# Patient Record
Sex: Male | Born: 1935 | Race: White | Hispanic: No | Marital: Married | Smoking: Current some day smoker
Health system: Southern US, Community
[De-identification: ages and names within clinical notes are randomized; demographics above are authoritative.]

## PROBLEM LIST (undated history)

## (undated) DIAGNOSIS — M109 Gout, unspecified: Secondary | ICD-10-CM

## (undated) DIAGNOSIS — I10 Essential (primary) hypertension: Secondary | ICD-10-CM

## (undated) DIAGNOSIS — E538 Deficiency of other specified B group vitamins: Secondary | ICD-10-CM

## (undated) DIAGNOSIS — F0390 Unspecified dementia without behavioral disturbance: Secondary | ICD-10-CM

---

## 1998-02-11 HISTORY — PX: CHOLECYSTECTOMY: SHX55

## 2007-06-18 ENCOUNTER — Ambulatory Visit: Payer: Self-pay | Admitting: Orthopedic Surgery

## 2007-06-18 DIAGNOSIS — M23302 Other meniscus derangements, unspecified lateral meniscus, unspecified knee: Secondary | ICD-10-CM

## 2007-06-18 DIAGNOSIS — M25469 Effusion, unspecified knee: Secondary | ICD-10-CM

## 2007-06-18 DIAGNOSIS — M25569 Pain in unspecified knee: Secondary | ICD-10-CM

## 2008-05-31 ENCOUNTER — Ambulatory Visit: Payer: Self-pay | Admitting: Orthopedic Surgery

## 2008-05-31 DIAGNOSIS — M79609 Pain in unspecified limb: Secondary | ICD-10-CM

## 2008-05-31 DIAGNOSIS — M7150 Other bursitis, not elsewhere classified, unspecified site: Secondary | ICD-10-CM | POA: Insufficient documentation

## 2009-10-09 ENCOUNTER — Encounter: Admission: RE | Admit: 2009-10-09 | Discharge: 2009-10-09 | Payer: Self-pay | Admitting: Neurology

## 2011-11-12 ENCOUNTER — Encounter (HOSPITAL_COMMUNITY)
Admission: RE | Admit: 2011-11-12 | Discharge: 2011-11-12 | Disposition: A | Discharge status: Home / Self Care | Payer: Medicare Other | Source: Ambulatory Visit | Attending: Ophthalmology | Admitting: Ophthalmology

## 2011-11-12 ENCOUNTER — Encounter (HOSPITAL_COMMUNITY): Payer: Self-pay

## 2011-11-12 ENCOUNTER — Other Ambulatory Visit: Payer: Self-pay

## 2011-11-12 ENCOUNTER — Encounter (HOSPITAL_COMMUNITY): Payer: Self-pay | Admitting: Pharmacy Technician

## 2011-11-12 HISTORY — DX: Gout, unspecified: M10.9

## 2011-11-12 HISTORY — DX: Unspecified dementia, unspecified severity, without behavioral disturbance, psychotic disturbance, mood disturbance, and anxiety: F03.90

## 2011-11-12 HISTORY — DX: Essential (primary) hypertension: I10

## 2011-11-12 HISTORY — DX: Deficiency of other specified B group vitamins: E53.8

## 2011-11-12 LAB — BASIC METABOLIC PANEL
CO2: 28 mEq/L (ref 19–32)
Calcium: 10.1 mg/dL (ref 8.4–10.5)
Chloride: 100 mEq/L (ref 96–112)
GFR calc Af Amer: 46 mL/min — ABNORMAL LOW (ref >90)
Potassium: 4.9 mEq/L (ref 3.5–5.1)

## 2011-11-12 LAB — HEMOGLOBIN AND HEMATOCRIT, BLOOD: Hemoglobin: 12.7 g/dL — ABNORMAL LOW (ref 13.0–17.0)

## 2011-11-12 MED ORDER — CYCLOPENTOLATE HCL 1 % OP SOLN
OPHTHALMIC | Status: AC
  Filled 2011-11-12: qty 2

## 2011-11-12 NOTE — Patient Instructions (Addendum)
Your procedure is scheduled on: 11/18/2011  Report to [facility] at  700       AM.  Call this number if you have problems the morning of surgery: [phone]   Do not eat food or drink liquids :After Midnight.      Take these medicines the morning of surgery with A SIP OF WATER: Metoprolol,lisinopril,HCTZ   Do not wear jewelry, make-up or nail polish.  Do not wear lotions, powders, or perfumes. You may wear deodorant.  Do not shave 48 hours prior to surgery.  Do not bring valuables to the hospital.  Contacts, dentures or bridgework may not be worn into surgery.  Leave suitcase in the car. After surgery it may be brought to your room.  For patients admitted to the hospital, checkout time is 11:00 AM the day of discharge.   Patients discharged the day of surgery will not be allowed to drive home.  :     Please read over the following fact sheets that you were given: Coughing and Deep Breathing, Surgical Site Infection Prevention, Anesthesia Post-op Instructions and Care and Recovery After Surgery    Cataract A cataract is a clouding of the lens of the eye. When a lens becomes cloudy, vision is reduced based on the degree and nature of the clouding. Many cataracts reduce vision to some degree. Some cataracts make people more near-sighted as they develop. Other cataracts increase glare. Cataracts that are ignored and become worse can sometimes look white. The white color can be seen through the pupil. CAUSES   Aging. However, cataracts may occur at any age, even in newborns.   Certain drugs.   Trauma to the eye.   Certain diseases such as diabetes.   Specific eye diseases such as chronic inflammation inside the eye or a sudden attack of a rare form of glaucoma.   Inherited or acquired medical problems.  SYMPTOMS   Gradual, progressive drop in vision in the affected eye.   Severe, rapid visual loss. This most often happens when trauma is the cause.  DIAGNOSIS  To detect a  cataract, an eye doctor examines the lens. Cataracts are best diagnosed with an exam of the eyes with the pupils enlarged (dilated) by drops.  TREATMENT  For an early cataract, vision may improve by using different eyeglasses or stronger lighting. If that does not help your vision, surgery is the only effective treatment. A cataract needs to be surgically removed when vision loss interferes with your everyday activities, such as driving, reading, or watching TV. A cataract may also have to be removed if it prevents examination or treatment of another eye problem. Surgery removes the cloudy lens and usually replaces it with a substitute lens (intraocular lens, IOL).  At a time when both you and your doctor agree, the cataract will be surgically removed. If you have cataracts in both eyes, only one is usually removed at a time. This allows the operated eye to heal and be out of danger from any possible problems after surgery (such as infection or poor wound healing). In rare cases, a cataract may be doing damage to your eye. In these cases, your caregiver may advise surgical removal right away. The vast majority of people who have cataract surgery have better vision afterward. HOME CARE INSTRUCTIONS  If you are not planning surgery, you may be asked to do the following:  Use different eyeglasses.   Use stronger or brighter lighting.   Ask your eye doctor about  reducing your medicine dose or changing medicines if it is thought that a medicine caused your cataract. Changing medicines does not make the cataract go away on its own.   Become familiar with your surroundings. Poor vision can lead to injury. Avoid bumping into things on the affected side. You are at a higher risk for tripping or falling.   Exercise extreme care when driving or operating machinery.   Wear sunglasses if you are sensitive to bright light or experiencing problems with glare.  SEEK IMMEDIATE MEDICAL CARE IF:   You have a  worsening or sudden vision loss.   You notice redness, swelling, or increasing pain in the eye.   You have a fever.  Document Released: 01/28/2005 Document Revised: 01/17/2011 Document Reviewed: 09/21/2010 [facility] Patient Information 2012 [place], [place].PATIENT INSTRUCTIONS POST-ANESTHESIA  IMMEDIATELY FOLLOWING SURGERY:  Do not drive or operate machinery for the first twenty four hours after surgery.  Do not make any important decisions for twenty four hours after surgery or while taking narcotic pain medications or sedatives.  If you develop intractable nausea and vomiting or a severe headache please notify your doctor immediately.  FOLLOW-UP:  Please make an appointment with your surgeon as instructed. You do not need to follow up with anesthesia unless specifically instructed to do so.  WOUND CARE INSTRUCTIONS (if applicable):  Keep a dry clean dressing on the anesthesia/puncture wound site if there is drainage.  Once the wound has quit draining you may leave it open to air.  Generally you should leave the bandage intact for twenty four hours unless there is drainage.  If the epidural site drains for more than 36-48 hours please call the anesthesia department.  QUESTIONS?:  Please feel free to call your physician or the hospital operator if you have any questions, and they will be happy to assist you.

## 2011-11-12 NOTE — Progress Notes (Signed)
11/12/11 1316  OBSTRUCTIVE SLEEP APNEA  Have you ever been diagnosed with sleep apnea through a sleep study? No  Do you snore loudly (loud enough to be heard through closed doors)?  1  Do you often feel tired, fatigued, or sleepy during the daytime? 0  Has anyone observed you stop breathing during your sleep? 0  Do you have, or are you being treated for high blood pressure? 1  BMI more than 35 kg/m2? 0  Age over 76 years old? 1  Neck circumference greater than 40 cm/18 inches? 0  Gender: 1  Obstructive Sleep Apnea Score 4   Score 4 or greater  No PCP

## 2011-11-18 ENCOUNTER — Encounter (HOSPITAL_COMMUNITY)
Admission: RE | Disposition: A | Discharge status: Home / Self Care | Payer: Self-pay | Source: Ambulatory Visit | Attending: Ophthalmology

## 2011-11-18 ENCOUNTER — Encounter (HOSPITAL_COMMUNITY): Payer: Self-pay | Admitting: *Deleted

## 2011-11-18 ENCOUNTER — Encounter (HOSPITAL_COMMUNITY): Payer: Self-pay | Admitting: Anesthesiology

## 2011-11-18 ENCOUNTER — Ambulatory Visit (HOSPITAL_COMMUNITY)
Admission: RE | Admit: 2011-11-18 | Discharge: 2011-11-18 | Disposition: A | Discharge status: Home / Self Care | Payer: Medicare Other | Source: Ambulatory Visit | Attending: Ophthalmology | Admitting: Ophthalmology

## 2011-11-18 ENCOUNTER — Ambulatory Visit (HOSPITAL_COMMUNITY): Payer: Medicare Other | Admitting: Anesthesiology

## 2011-11-18 DIAGNOSIS — Z01810 Encounter for preprocedural cardiovascular examination: Secondary | ICD-10-CM | POA: Insufficient documentation

## 2011-11-18 DIAGNOSIS — Z01812 Encounter for preprocedural laboratory examination: Secondary | ICD-10-CM | POA: Insufficient documentation

## 2011-11-18 DIAGNOSIS — H2510 Age-related nuclear cataract, unspecified eye: Secondary | ICD-10-CM | POA: Insufficient documentation

## 2011-11-18 DIAGNOSIS — Z79899 Other long term (current) drug therapy: Secondary | ICD-10-CM | POA: Insufficient documentation

## 2011-11-18 DIAGNOSIS — E119 Type 2 diabetes mellitus without complications: Secondary | ICD-10-CM | POA: Insufficient documentation

## 2011-11-18 DIAGNOSIS — I10 Essential (primary) hypertension: Secondary | ICD-10-CM | POA: Insufficient documentation

## 2011-11-18 HISTORY — PX: CATARACT EXTRACTION W/PHACO: SHX586

## 2011-11-18 SURGERY — PHACOEMULSIFICATION, CATARACT, WITH IOL INSERTION
Anesthesia: Monitor Anesthesia Care | Site: Eye | Laterality: Left | Wound class: Clean

## 2011-11-18 MED ORDER — LIDOCAINE HCL 3.5 % OP GEL: 1.0000 "application " | Freq: Once | OPHTHALMIC | Status: DC

## 2011-11-18 MED ORDER — LIDOCAINE 3.5 % OP GEL OPTIME - NO CHARGE
OPHTHALMIC | Status: DC | PRN
  Administered 2011-11-18: 1 [drp] via OPHTHALMIC

## 2011-11-18 MED ORDER — LIDOCAINE HCL (PF) 1 % IJ SOLN
INTRAMUSCULAR | Status: DC | PRN
  Administered 2011-11-18: .5 mL

## 2011-11-18 MED ORDER — MIDAZOLAM HCL 2 MG/2ML IJ SOLN
INTRAMUSCULAR | Status: AC
  Filled 2011-11-18: qty 2

## 2011-11-18 MED ORDER — MIDAZOLAM HCL 5 MG/5ML IJ SOLN
INTRAMUSCULAR | Status: DC | PRN
  Administered 2011-11-18: 1 mg via INTRAVENOUS

## 2011-11-18 MED ORDER — MOXIFLOXACIN HCL 0.5 % OP SOLN - NO CHARGE
1.0000 [drp] | Freq: Once | OPHTHALMIC | Status: DC
  Filled 2011-11-18: qty 3

## 2011-11-18 MED ORDER — BSS IO SOLN
INTRAOCULAR | Status: DC | PRN
  Administered 2011-11-18: 15 mL via INTRAOCULAR

## 2011-11-18 MED ORDER — LACTATED RINGERS IV SOLN
INTRAVENOUS | Status: DC | PRN
  Administered 2011-11-18: 08:00:00 via INTRAVENOUS

## 2011-11-18 MED ORDER — TETRACAINE HCL 0.5 % OP SOLN
OPHTHALMIC | Status: AC
  Filled 2011-11-18: qty 2

## 2011-11-18 MED ORDER — MIDAZOLAM HCL 2 MG/2ML IJ SOLN
1.0000 mg | INTRAMUSCULAR | Status: DC | PRN
  Administered 2011-11-18: 2 mg via INTRAVENOUS

## 2011-11-18 MED ORDER — NA HYALUR & NA CHOND-NA HYALUR 0.55-0.5 ML IO KIT
PACK | INTRAOCULAR | Status: DC | PRN
  Administered 2011-11-18: 1 via OPHTHALMIC

## 2011-11-18 MED ORDER — LIDOCAINE HCL 3.5 % OP GEL
OPHTHALMIC | Status: AC
  Filled 2011-11-18: qty 5

## 2011-11-18 MED ORDER — CYCLOPENTOLATE-PHENYLEPHRINE 0.2-1 % OP SOLN
1.0000 [drp] | Freq: Once | OPHTHALMIC | Status: AC
  Administered 2011-11-18: 1 [drp] via OPHTHALMIC

## 2011-11-18 MED ORDER — KETOROLAC TROMETHAMINE 0.4 % OP SOLN - NO CHARGE
1.0000 [drp] | Freq: Once | OPHTHALMIC | Status: AC
  Administered 2011-11-18: 1 [drp] via OPHTHALMIC
  Filled 2011-11-18: qty 5

## 2011-11-18 MED ORDER — GATIFLOXACIN 0.5 % OP SOLN OPTIME - NO CHARGE
1.0000 [drp] | Freq: Once | OPHTHALMIC | Status: AC
  Administered 2011-11-18: 1 [drp] via OPHTHALMIC
  Filled 2011-11-18: qty 2.5

## 2011-11-18 MED ORDER — GATIFLOXACIN 0.5 % OP SOLN OPTIME - NO CHARGE
OPHTHALMIC | Status: DC | PRN
  Administered 2011-11-18: 1 [drp] via OPHTHALMIC

## 2011-11-18 MED ORDER — LACTATED RINGERS IV SOLN
INTRAVENOUS | Status: DC
  Administered 2011-11-18: 1000 mL via INTRAVENOUS

## 2011-11-18 MED ORDER — EPINEPHRINE HCL 1 MG/ML IJ SOLN
INTRAOCULAR | Status: DC | PRN
  Administered 2011-11-18: 09:00:00

## 2011-11-18 MED ORDER — TETRACAINE 0.5 % OP SOLN OPTIME - NO CHARGE
OPHTHALMIC | Status: DC | PRN
  Administered 2011-11-18: 2 [drp] via OPHTHALMIC

## 2011-11-18 MED ORDER — LIDOCAINE HCL (PF) 1 % IJ SOLN
INTRAMUSCULAR | Status: AC
  Filled 2011-11-18: qty 2

## 2011-11-18 SURGICAL SUPPLY — 27 items
CAPSULAR TENSION RING-AMO (OPHTHALMIC RELATED) IMPLANT
CLOTH BEACON ORANGE TIMEOUT ST (SAFETY) ×2 IMPLANT
GLOVE BIO SURGEON STRL SZ7.5 (GLOVE) IMPLANT
GLOVE BIOGEL M 6.5 STRL (GLOVE) IMPLANT
GLOVE BIOGEL PI IND STRL 6.5 (GLOVE) IMPLANT
GLOVE BIOGEL PI IND STRL 7.0 (GLOVE) ×1 IMPLANT
GLOVE BIOGEL PI INDICATOR 6.5 (GLOVE)
GLOVE BIOGEL PI INDICATOR 7.0 (GLOVE) ×1
GLOVE ECLIPSE 6.5 STRL STRAW (GLOVE) IMPLANT
GLOVE ECLIPSE 7.5 STRL STRAW (GLOVE) IMPLANT
GLOVE EXAM NITRILE LRG STRL (GLOVE) IMPLANT
GLOVE EXAM NITRILE MD LF STRL (GLOVE) ×2 IMPLANT
GLOVE SKINSENSE NS SZ6.5 (GLOVE)
GLOVE SKINSENSE NS SZ7.0 (GLOVE)
GLOVE SKINSENSE STRL SZ6.5 (GLOVE) IMPLANT
GLOVE SKINSENSE STRL SZ7.0 (GLOVE) IMPLANT
INST SET CATARACT ~~LOC~~ (KITS) ×2 IMPLANT
KIT VITRECTOMY (OPHTHALMIC RELATED) IMPLANT
PAD ARMBOARD 7.5X6 YLW CONV (MISCELLANEOUS) ×2 IMPLANT
PROC W NO LENS (INTRAOCULAR LENS)
PROC W SPEC LENS (INTRAOCULAR LENS)
PROCESS W NO LENS (INTRAOCULAR LENS) IMPLANT
PROCESS W SPEC LENS (INTRAOCULAR LENS) IMPLANT
RING MALYGIN (MISCELLANEOUS) IMPLANT
SIGHTPATH CAT PROC W REG LENS (Ophthalmic Related) ×2 IMPLANT
VISCOELASTIC ADDITIONAL (OPHTHALMIC RELATED) IMPLANT
WATER STERILE IRR 250ML POUR (IV SOLUTION) ×2 IMPLANT

## 2011-11-18 NOTE — Preoperative (Signed)
Beta Blockers   Reason not to administer Beta Blockers:Not Applicable 

## 2011-11-18 NOTE — H&P (Signed)
I have reviewed the pre printed H&P, the patient was re-examined, and I have identified no significant interval changes in the patient's medical condition.  There is no change in the plan of care since the history and physical of record. 

## 2011-11-18 NOTE — Brief Op Note (Signed)
11/18/2011  10:29 AM  PATIENT:  Charles Scott  78 y.o. male  PRE-OPERATIVE DIAGNOSIS:  nuclear cataract left eye  POST-OPERATIVE DIAGNOSIS:  nuclear cataract left eye  PROCEDURE:  Procedure(s): CATARACT EXTRACTION PHACO AND INTRAOCULAR LENS PLACEMENT (IOC)  SURGEON:  Surgeon(s): Susa Simmonds, MD  ASSISTANTS: Valda Lamb, CST   ANESTHESIA STAFF: Kristopher Key, CRNA - CRNA Laurene Footman, MD - Anesthesiologist  ANESTHESIA:   topical and MAC  REQUESTED LENS POWER: 22.5  LENS IMPLANT INFORMATION: Alcon SN60 WF  S/n [id number]  Exp 04/2015  CUMULATIVE DISSIPATED ENERGY:74.35  INDICATIONS:see H&P indications  OP FINDINGS:dense NS  COMPLICATIONS:None  DICTATION #: none  PLAN OF CARE: per office instructions  PATIENT DISPOSITION:  Short Stay

## 2011-11-18 NOTE — Op Note (Signed)
See scanned op note done today o a different system

## 2011-11-18 NOTE — Anesthesia Postprocedure Evaluation (Signed)
  Anesthesia Post-op Note  Patient: Charles Scott  Procedure(s) Performed: Procedure(s) (LRB) with comments: CATARACT EXTRACTION PHACO AND INTRAOCULAR LENS PLACEMENT (IOC) (Left) - CDE:74.35  Patient Location: Short Stay  Anesthesia Type: MAC  Level of Consciousness: awake, alert  and oriented  Airway and Oxygen Therapy: Patient Spontanous Breathing  Post-op Pain: none  Post-op Assessment: Post-op Vital signs reviewed, Patient's Cardiovascular Status Stable, Respiratory Function Stable, Patent Airway, No signs of Nausea or vomiting and Pain level controlled  Post-op Vital Signs: Reviewed and stable  Complications: No apparent anesthesia complications

## 2011-11-18 NOTE — Transfer of Care (Signed)
Immediate Anesthesia Transfer of Care Note  Patient: Charles Scott  Procedure(s) Performed: Procedure(s) (LRB) with comments: CATARACT EXTRACTION PHACO AND INTRAOCULAR LENS PLACEMENT (IOC) (Left) - CDE:74.35  Patient Location: Short Stay  Anesthesia Type: MAC  Level of Consciousness: awake, alert  and oriented  Airway & Oxygen Therapy: Patient Spontanous Breathing  Post-op Assessment: Report given to PACU RN  Post vital signs: Reviewed and stable  Complications: No apparent anesthesia complications

## 2011-11-18 NOTE — Anesthesia Preprocedure Evaluation (Addendum)
Anesthesia Evaluation  Patient identified by MRN, date of birth, ID band Patient awake    Reviewed: Allergy & Precautions, H&P , NPO status , Patient's Chart, lab work & pertinent test results  Airway Mallampati: II      Dental  (+) Teeth Intact   Pulmonary neg pulmonary ROS,    Pulmonary exam normal       Cardiovascular hypertension, Pt. on medications Rhythm:Regular     Neuro/Psych PSYCHIATRIC DISORDERS (dementia)    GI/Hepatic   Endo/Other  diabetes, Type 2, Oral Hypoglycemic Agents  Renal/GU      Musculoskeletal   Abdominal   Peds  Hematology   Anesthesia Other Findings   Reproductive/Obstetrics                           Anesthesia Physical Anesthesia Plan  ASA: III  Anesthesia Plan: MAC   Post-op Pain Management:    Induction: Intravenous  Airway Management Planned: Nasal Cannula  Additional Equipment:   Intra-op Plan:   Post-operative Plan:   Informed Consent: I have reviewed the patients History and Physical, chart, labs and discussed the procedure including the risks, benefits and alternatives for the proposed anesthesia with the patient or authorized representative who has indicated his/her understanding and acceptance.     Plan Discussed with:   Anesthesia Plan Comments:         Anesthesia Quick Evaluation  

## 2011-11-20 ENCOUNTER — Encounter (HOSPITAL_COMMUNITY): Payer: Self-pay | Admitting: Ophthalmology

## 2012-03-23 NOTE — Patient Instructions (Signed)
Charles Scott  03/23/2012   Your procedure is scheduled on:  03/30/12  Report to [facility] at 0800 AM.  Call this number if you have problems the morning of surgery: [phone]   Remember:   Do not eat food or drink liquids after midnight.   Take these medicines the morning of surgery with A SIP OF WATER: lisinopril, lopressor   Do not wear jewelry, make-up or nail polish.  Do not wear lotions, powders, or perfumes. You may wear deodorant.  Do not shave 48 hours prior to surgery. Men may shave face and neck.  Do not bring valuables to the hospital.  Contacts, dentures or bridgework may not be worn into surgery.  Leave suitcase in the car. After surgery it may be brought to your room.  For patients admitted to the hospital, checkout time is 11:00 AM the day of  discharge.   Patients discharged the day of surgery will not be allowed to drive  home.  Name and phone number of your driver: family  Special Instructions: N/A   Please read over the following fact sheets that you were given: Anesthesia Post-op Instructions and Care and Recovery After Surgery   PATIENT INSTRUCTIONS POST-ANESTHESIA  IMMEDIATELY FOLLOWING SURGERY:  Do not drive or operate machinery for the first twenty four hours after surgery.  Do not make any important decisions for twenty four hours after surgery or while taking narcotic pain medications or sedatives.  If you develop intractable nausea and vomiting or a severe headache please notify your doctor immediately.  FOLLOW-UP:  Please make an appointment with your surgeon as instructed. You do not need to follow up with anesthesia unless specifically instructed to do so.  WOUND CARE INSTRUCTIONS (if applicable):  Keep a dry clean dressing on the anesthesia/puncture wound site if there is drainage.  Once the wound has quit draining you may leave it open to air.  Generally you should leave the bandage intact for twenty four hours unless there is drainage.  If the  epidural site drains for more than 36-48 hours please call the anesthesia department.  QUESTIONS?:  Please feel free to call your physician or the hospital operator if you have any questions, and they will be happy to assist you.      Cataract Surgery  A cataract is a clouding of the lens of the eye. When a lens becomes cloudy, vision is reduced based on the degree and nature of the clouding. Surgery may be needed to improve vision. Surgery removes the cloudy lens and usually replaces it with a substitute lens (intraocular lens, IOL). LET YOUR EYE DOCTOR KNOW ABOUT:  Allergies to food or medicine.  Medicines taken including herbs, eyedrops, over-the-counter medicines, and creams.  Use of steroids (by mouth or creams).  Previous problems with anesthetics or numbing medicine.  History of bleeding problems or blood clots.  Previous surgery.  Other health problems, including diabetes and kidney problems.  Possibility of pregnancy, if this applies. RISKS AND COMPLICATIONS  Infection.  Inflammation of the eyeball (endophthalmitis) that can spread to both eyes (sympathetic ophthalmia).  Poor wound healing.  If an IOL is inserted, it can later fall out of proper position. This is very uncommon.  Clouding of the part of your eye that holds an IOL in place. This is called an "after-cataract." These are uncommon, but easily treated. BEFORE THE PROCEDURE  Do not eat or drink anything except small amounts of water for 8 to 12 before your surgery,  or as directed by your caregiver.  Unless you are told otherwise, continue any eyedrops you have been prescribed.  Talk to your primary caregiver about all other medicines that you take (both prescription and non-prescription). In some cases, you may need to stop or change medicines near the time of your surgery. This is most important if you are taking blood-thinning medicine.Do not stop medicines unless you are told to do so.  Arrange for  someone to drive you to and from the procedure.  Do not put contact lenses in either eye on the day of your surgery. PROCEDURE There is more than one method for safely removing a cataract. Your doctor can explain the differences and help determine which is best for you. Phacoemulsification surgery is the most common form of cataract surgery.  An injection is given behind the eye or eyedrops are given to make this a painless procedure.  A small cut (incision) is made on the edge of the clear, dome-shaped surface that covers the front of the eye (cornea).  A tiny probe is painlessly inserted into the eye. This device gives off ultrasound waves that soften and break up the cloudy center of the lens. This makes it easier for the cloudy lens to be removed by suction.  An IOL may be implanted.  The normal lens of the eye is covered by a clear capsule. Part of that capsule is intentionally left in the eye to support the IOL.  Your surgeon may or may not use stitches to close the incision. There are other forms of cataract surgery that require a larger incision and stiches to close the eye. This approach is taken in cases where the doctor feels that the cataract cannot be easily removed using phacoemulsification. AFTER THE PROCEDURE  When an IOL is implanted, it does not need care. It becomes a permanent part of your eye and cannot be seen or felt.  Your doctor will schedule follow-up exams to check on your progress.  Review your other medicines with your doctor to see which can be resumed after surgery.  Use eyedrops or take medicine as prescribed by your doctor. Document Released: 01/17/2011 Document Revised: 04/22/2011 Document Reviewed: 01/17/2011 [facility] Patient Information 2013 [place], [place].

## 2012-03-24 ENCOUNTER — Encounter (HOSPITAL_COMMUNITY)
Admission: RE | Admit: 2012-03-24 | Discharge: 2012-03-24 | Disposition: A | Discharge status: Home / Self Care | Payer: Medicare Other | Source: Ambulatory Visit | Attending: Ophthalmology | Admitting: Ophthalmology

## 2012-03-24 ENCOUNTER — Encounter (HOSPITAL_COMMUNITY): Payer: Self-pay

## 2012-03-24 LAB — BASIC METABOLIC PANEL
BUN: 25 mg/dL — ABNORMAL HIGH (ref 6–23)
Calcium: 10.3 mg/dL (ref 8.4–10.5)
Creatinine, Ser: 1.64 mg/dL — ABNORMAL HIGH (ref 0.50–1.35)
GFR calc Af Amer: 44 mL/min — ABNORMAL LOW (ref >90)
GFR calc non Af Amer: 38 mL/min — ABNORMAL LOW (ref >90)

## 2012-03-24 LAB — HEMOGLOBIN AND HEMATOCRIT, BLOOD: Hemoglobin: 13.8 g/dL (ref 13.0–17.0)

## 2012-03-24 MED ORDER — CYCLOPENTOLATE-PHENYLEPHRINE 0.2-1 % OP SOLN
OPHTHALMIC | Status: AC
  Filled 2012-03-24: qty 2

## 2012-03-25 NOTE — Pre-Procedure Instructions (Signed)
Dr Jayme Cloud aware of K+ of 5.2. No new orders given.

## 2012-03-27 NOTE — OR Nursing (Signed)
Potassium 5.2, reported to dr. Jayme Cloud. No new orders.

## 2012-03-30 ENCOUNTER — Ambulatory Visit (HOSPITAL_COMMUNITY)
Admission: RE | Admit: 2012-03-30 | Discharge: 2012-03-30 | Disposition: A | Discharge status: Home / Self Care | Payer: Medicare Other | Source: Ambulatory Visit | Attending: Ophthalmology | Admitting: Ophthalmology

## 2012-03-30 ENCOUNTER — Encounter (HOSPITAL_COMMUNITY): Payer: Self-pay | Admitting: *Deleted

## 2012-03-30 ENCOUNTER — Encounter (HOSPITAL_COMMUNITY)
Admission: RE | Disposition: A | Discharge status: Home / Self Care | Payer: Self-pay | Source: Ambulatory Visit | Attending: Ophthalmology

## 2012-03-30 ENCOUNTER — Encounter (HOSPITAL_COMMUNITY): Payer: Self-pay | Admitting: Anesthesiology

## 2012-03-30 ENCOUNTER — Ambulatory Visit (HOSPITAL_COMMUNITY): Payer: Medicare Other | Admitting: Anesthesiology

## 2012-03-30 DIAGNOSIS — H2510 Age-related nuclear cataract, unspecified eye: Secondary | ICD-10-CM | POA: Insufficient documentation

## 2012-03-30 DIAGNOSIS — Z79899 Other long term (current) drug therapy: Secondary | ICD-10-CM | POA: Insufficient documentation

## 2012-03-30 DIAGNOSIS — I10 Essential (primary) hypertension: Secondary | ICD-10-CM | POA: Insufficient documentation

## 2012-03-30 DIAGNOSIS — Z01810 Encounter for preprocedural cardiovascular examination: Secondary | ICD-10-CM | POA: Insufficient documentation

## 2012-03-30 DIAGNOSIS — Z01812 Encounter for preprocedural laboratory examination: Secondary | ICD-10-CM | POA: Insufficient documentation

## 2012-03-30 DIAGNOSIS — E119 Type 2 diabetes mellitus without complications: Secondary | ICD-10-CM | POA: Insufficient documentation

## 2012-03-30 HISTORY — PX: CATARACT EXTRACTION W/PHACO: SHX586

## 2012-03-30 SURGERY — PHACOEMULSIFICATION, CATARACT, WITH IOL INSERTION
Anesthesia: Monitor Anesthesia Care | Site: Eye | Laterality: Right | Wound class: Clean

## 2012-03-30 MED ORDER — LIDOCAINE 3.5 % OP GEL OPTIME - NO CHARGE
OPHTHALMIC | Status: DC | PRN
  Administered 2012-03-30: 2 [drp] via OPHTHALMIC

## 2012-03-30 MED ORDER — GATIFLOXACIN 0.5 % OP SOLN OPTIME - NO CHARGE
1.0000 [drp] | Freq: Once | OPHTHALMIC | Status: AC
  Administered 2012-03-30: 1 [drp] via OPHTHALMIC
  Filled 2012-03-30: qty 2.5

## 2012-03-30 MED ORDER — TETRACAINE HCL 0.5 % OP SOLN
OPHTHALMIC | Status: AC
  Filled 2012-03-30: qty 2

## 2012-03-30 MED ORDER — KETOROLAC TROMETHAMINE 0.4 % OP SOLN - NO CHARGE
1.0000 [drp] | Freq: Once | OPHTHALMIC | Status: AC
  Administered 2012-03-30: 1 [drp] via OPHTHALMIC
  Filled 2012-03-30: qty 5

## 2012-03-30 MED ORDER — EPINEPHRINE HCL 1 MG/ML IJ SOLN
INTRAMUSCULAR | Status: AC
  Filled 2012-03-30: qty 1

## 2012-03-30 MED ORDER — CYCLOPENTOLATE-PHENYLEPHRINE 0.2-1 % OP SOLN
1.0000 [drp] | OPHTHALMIC | Status: AC
  Administered 2012-03-30: 1 [drp] via OPHTHALMIC

## 2012-03-30 MED ORDER — GATIFLOXACIN 0.5 % OP SOLN OPTIME - NO CHARGE
OPHTHALMIC | Status: DC | PRN
  Administered 2012-03-30: 1 [drp] via OPHTHALMIC

## 2012-03-30 MED ORDER — DEXTROSE 50 % IV SOLN
25.0000 mL | Freq: Once | INTRAVENOUS | Status: AC
  Administered 2012-03-30: 25 mL via INTRAVENOUS

## 2012-03-30 MED ORDER — MIDAZOLAM HCL 2 MG/2ML IJ SOLN
INTRAMUSCULAR | Status: AC
  Filled 2012-03-30: qty 2

## 2012-03-30 MED ORDER — TETRACAINE 0.5 % OP SOLN OPTIME - NO CHARGE
OPHTHALMIC | Status: DC | PRN
  Administered 2012-03-30: 2 [drp] via OPHTHALMIC

## 2012-03-30 MED ORDER — NA HYALUR & NA CHOND-NA HYALUR 0.55-0.5 ML IO KIT
PACK | INTRAOCULAR | Status: DC | PRN
  Administered 2012-03-30: 1 via OPHTHALMIC

## 2012-03-30 MED ORDER — LIDOCAINE HCL 3.5 % OP GEL
OPHTHALMIC | Status: AC
  Filled 2012-03-30: qty 5

## 2012-03-30 MED ORDER — DEXTROSE 50 % IV SOLN
INTRAVENOUS | Status: AC
  Filled 2012-03-30: qty 50

## 2012-03-30 MED ORDER — POVIDONE-IODINE 5 % OP SOLN
OPHTHALMIC | Status: DC | PRN
  Administered 2012-03-30: 2 via OPHTHALMIC

## 2012-03-30 MED ORDER — MOXIFLOXACIN HCL 0.5 % OP SOLN - NO CHARGE
1.0000 [drp] | Freq: Once | OPHTHALMIC | Status: DC
  Filled 2012-03-30: qty 3

## 2012-03-30 MED ORDER — MIDAZOLAM HCL 5 MG/5ML IJ SOLN
INTRAMUSCULAR | Status: DC | PRN
  Administered 2012-03-30 (×2): 1 mg via INTRAVENOUS

## 2012-03-30 MED ORDER — LACTATED RINGERS IV SOLN
INTRAVENOUS | Status: DC
  Administered 2012-03-30: 08:00:00 via INTRAVENOUS

## 2012-03-30 MED ORDER — EPINEPHRINE HCL 1 MG/ML IJ SOLN
INTRAOCULAR | Status: DC | PRN
  Administered 2012-03-30: 09:00:00

## 2012-03-30 MED ORDER — BSS IO SOLN
INTRAOCULAR | Status: DC | PRN
  Administered 2012-03-30: 15 mL via INTRAOCULAR

## 2012-03-30 MED ORDER — MIDAZOLAM HCL 2 MG/2ML IJ SOLN
1.0000 mg | INTRAMUSCULAR | Status: DC | PRN
  Administered 2012-03-30: 2 mg via INTRAVENOUS

## 2012-03-30 SURGICAL SUPPLY — 28 items
CAPSULAR TENSION RING-AMO (OPHTHALMIC RELATED) IMPLANT
CLOTH BEACON ORANGE TIMEOUT ST (SAFETY) ×2 IMPLANT
ETHILON 10-0 TG 160-6 ×2 IMPLANT
GLOVE BIO SURGEON STRL SZ7.5 (GLOVE) IMPLANT
GLOVE BIOGEL M 6.5 STRL (GLOVE) IMPLANT
GLOVE BIOGEL PI IND STRL 6.5 (GLOVE) IMPLANT
GLOVE BIOGEL PI IND STRL 7.0 (GLOVE) ×1 IMPLANT
GLOVE BIOGEL PI INDICATOR 6.5 (GLOVE)
GLOVE BIOGEL PI INDICATOR 7.0 (GLOVE) ×1
GLOVE ECLIPSE 6.5 STRL STRAW (GLOVE) IMPLANT
GLOVE ECLIPSE 7.5 STRL STRAW (GLOVE) IMPLANT
GLOVE EXAM NITRILE LRG STRL (GLOVE) IMPLANT
GLOVE EXAM NITRILE MD LF STRL (GLOVE) ×2 IMPLANT
GLOVE SKINSENSE NS SZ6.5 (GLOVE)
GLOVE SKINSENSE NS SZ7.0 (GLOVE)
GLOVE SKINSENSE STRL SZ6.5 (GLOVE) IMPLANT
GLOVE SKINSENSE STRL SZ7.0 (GLOVE) IMPLANT
INST SET CATARACT ~~LOC~~ (KITS) ×2 IMPLANT
KIT VITRECTOMY (OPHTHALMIC RELATED) IMPLANT
PAD ARMBOARD 7.5X6 YLW CONV (MISCELLANEOUS) ×2 IMPLANT
PROC W NO LENS (INTRAOCULAR LENS)
PROC W SPEC LENS (INTRAOCULAR LENS)
PROCESS W NO LENS (INTRAOCULAR LENS) IMPLANT
PROCESS W SPEC LENS (INTRAOCULAR LENS) IMPLANT
RING MALYGIN (MISCELLANEOUS) IMPLANT
SIGHTPATH CAT PROC W REG LENS (Ophthalmic Related) ×2 IMPLANT
VISCOELASTIC ADDITIONAL (OPHTHALMIC RELATED) IMPLANT
WATER STERILE IRR 250ML POUR (IV SOLUTION) ×2 IMPLANT

## 2012-03-30 NOTE — Brief Op Note (Signed)
03/30/2012  10:06 AM  PATIENT:  Rocky Crafts  79 y.o. male  PRE-OPERATIVE DIAGNOSIS:  nuclear cataract right eye  POST-OPERATIVE DIAGNOSIS:  nuclear cataract right eye  PROCEDURE:  Procedure(s): CATARACT EXTRACTION PHACO AND INTRAOCULAR LENS PLACEMENT (IOC)  SURGEON:  Surgeon(s): Susa Simmonds, MD  ASSISTANTS: Valda Lamb, CST   ANESTHESIA STAFF: Anesthesiologist: Laurene Footman, MD CRNA: Franco Nones, CRNA  ANESTHESIA:   topical and MAC  REQUESTED LENS POWER: 20.5  LENS IMPLANT INFORMATION:  Alcon SN60WF  S/n [id number]  exp 02/2015  CUMULATIVE DISSIPATED ENERGY:74.16  INDICATIONS:see H&P for specific indications  OP FINDINGS:dense hard NS and positive vitreous pressure  COMPLICATIONS:None  DICTATION #: see scanned note  PLAN OF CARE: KPE w IOL OD as scheduled  PATIENT DISPOSITION:  Short Stay

## 2012-03-30 NOTE — H&P (Signed)
I have reviewed the pre printed H&P, the patient was re-examined, and I have identified no significant interval changes in the patient's medical condition.  There is no change in the plan of care since the history and physical of record. 

## 2012-03-30 NOTE — Anesthesia Postprocedure Evaluation (Signed)
  Anesthesia Post-op Note  Patient: Charles Scott  Procedure(s) Performed: Procedure(s) (LRB): CATARACT EXTRACTION PHACO AND INTRAOCULAR LENS PLACEMENT (IOC) (Right)  Patient Location:  Short Stay  Anesthesia Type: MAC  Level of Consciousness: awake  Airway and Oxygen Therapy: Patient Spontanous Breathing  Post-op Pain: none  Post-op Assessment: Post-op Vital signs reviewed, Patient's Cardiovascular Status Stable, Respiratory Function Stable, Patent Airway, No signs of Nausea or vomiting and Pain level controlled  Post-op Vital Signs: Reviewed and stable  Complications: No apparent anesthesia complications

## 2012-03-30 NOTE — Anesthesia Procedure Notes (Signed)
Procedure Name: MAC Date/Time: 03/30/2012 8:39 AM Performed by: Franco Nones Pre-anesthesia Checklist: Patient identified, Emergency Drugs available, Suction available, Timeout performed and Patient being monitored Patient Re-evaluated:Patient Re-evaluated prior to inductionOxygen Delivery Method: Nasal Cannula

## 2012-03-30 NOTE — Transfer of Care (Signed)
Immediate Anesthesia Transfer of Care Note  Patient: Charles Scott  Procedure(s) Performed: Procedure(s) (LRB): CATARACT EXTRACTION PHACO AND INTRAOCULAR LENS PLACEMENT (IOC) (Right)  Patient Location: Shortstay  Anesthesia Type: MAC  Level of Consciousness: awake  Airway & Oxygen Therapy: Patient Spontanous Breathing   Post-op Assessment: Report given to PACU RN, Post -op Vital signs reviewed and stable and Patient moving all extremities  Post vital signs: Reviewed and stable  Complications: No apparent anesthesia complications

## 2012-03-30 NOTE — Anesthesia Preprocedure Evaluation (Signed)
Anesthesia Evaluation  Patient identified by MRN, date of birth, ID band Patient awake    Reviewed: Allergy & Precautions, H&P , NPO status , Patient's Chart, lab work & pertinent test results  Airway Mallampati: II      Dental  (+) Teeth Intact   Pulmonary neg pulmonary ROS,    Pulmonary exam normal       Cardiovascular hypertension, Pt. on medications Rhythm:Regular     Neuro/Psych PSYCHIATRIC DISORDERS (dementia)    GI/Hepatic   Endo/Other  diabetes, Type 2, Oral Hypoglycemic Agents  Renal/GU      Musculoskeletal   Abdominal   Peds  Hematology   Anesthesia Other Findings   Reproductive/Obstetrics                           Anesthesia Physical Anesthesia Plan  ASA: III  Anesthesia Plan: MAC   Post-op Pain Management:    Induction: Intravenous  Airway Management Planned: Nasal Cannula  Additional Equipment:   Intra-op Plan:   Post-operative Plan:   Informed Consent: I have reviewed the patients History and Physical, chart, labs and discussed the procedure including the risks, benefits and alternatives for the proposed anesthesia with the patient or authorized representative who has indicated his/her understanding and acceptance.     Plan Discussed with:   Anesthesia Plan Comments:         Anesthesia Quick Evaluation

## 2012-03-30 NOTE — Op Note (Signed)
See scanned note.

## 2012-03-31 ENCOUNTER — Encounter (HOSPITAL_COMMUNITY): Payer: Self-pay | Admitting: Ophthalmology

## 2015-06-29 ENCOUNTER — Emergency Department (HOSPITAL_COMMUNITY)
Admission: EM | Admit: 2015-06-29 | Discharge: 2015-06-29 | Disposition: A | Discharge status: Home / Self Care | Payer: Medicare Other | Attending: Emergency Medicine | Admitting: Emergency Medicine

## 2015-06-29 ENCOUNTER — Encounter (HOSPITAL_COMMUNITY): Payer: Self-pay | Admitting: Emergency Medicine

## 2015-06-29 ENCOUNTER — Emergency Department (HOSPITAL_COMMUNITY): Payer: Medicare Other

## 2015-06-29 DIAGNOSIS — F17200 Nicotine dependence, unspecified, uncomplicated: Secondary | ICD-10-CM | POA: Insufficient documentation

## 2015-06-29 DIAGNOSIS — Z0000 Encounter for general adult medical examination without abnormal findings: Secondary | ICD-10-CM | POA: Diagnosis present

## 2015-06-29 DIAGNOSIS — E1165 Type 2 diabetes mellitus with hyperglycemia: Secondary | ICD-10-CM | POA: Insufficient documentation

## 2015-06-29 DIAGNOSIS — I10 Essential (primary) hypertension: Secondary | ICD-10-CM | POA: Insufficient documentation

## 2015-06-29 DIAGNOSIS — R739 Hyperglycemia, unspecified: Secondary | ICD-10-CM

## 2015-06-29 DIAGNOSIS — Z794 Long term (current) use of insulin: Secondary | ICD-10-CM | POA: Diagnosis not present

## 2015-06-29 DIAGNOSIS — Z79899 Other long term (current) drug therapy: Secondary | ICD-10-CM | POA: Diagnosis not present

## 2015-06-29 DIAGNOSIS — Z7984 Long term (current) use of oral hypoglycemic drugs: Secondary | ICD-10-CM | POA: Diagnosis not present

## 2015-06-29 DIAGNOSIS — F0390 Unspecified dementia without behavioral disturbance: Secondary | ICD-10-CM

## 2015-06-29 DIAGNOSIS — E139 Other specified diabetes mellitus without complications: Secondary | ICD-10-CM | POA: Diagnosis not present

## 2015-06-29 LAB — URINALYSIS, ROUTINE W REFLEX MICROSCOPIC
Bilirubin Urine: NEGATIVE
GLUCOSE, UA: 100 mg/dL — AB
Hgb urine dipstick: NEGATIVE
Ketones, ur: NEGATIVE mg/dL
LEUKOCYTES UA: NEGATIVE
NITRITE: NEGATIVE
PH: 5.5 (ref 5.0–8.0)
Protein, ur: 30 mg/dL — AB
Specific Gravity, Urine: 1.025 (ref 1.005–1.030)

## 2015-06-29 LAB — COMPREHENSIVE METABOLIC PANEL
ALT: 26 U/L (ref 17–63)
ANION GAP: 4 — AB (ref 5–15)
AST: 25 U/L (ref 15–41)
Albumin: 3.3 g/dL — ABNORMAL LOW (ref 3.5–5.0)
Alkaline Phosphatase: 69 U/L (ref 38–126)
BILIRUBIN TOTAL: 0.5 mg/dL (ref 0.3–1.2)
BUN: 23 mg/dL — AB (ref 6–20)
CALCIUM: 8.8 mg/dL — AB (ref 8.9–10.3)
CO2: 26 mmol/L (ref 22–32)
Chloride: 107 mmol/L (ref 101–111)
Creatinine, Ser: 1.75 mg/dL — ABNORMAL HIGH (ref 0.61–1.24)
GFR calc Af Amer: 40 mL/min — ABNORMAL LOW (ref >60)
GFR, EST NON AFRICAN AMERICAN: 35 mL/min — AB (ref >60)
Glucose, Bld: 239 mg/dL — ABNORMAL HIGH (ref 65–99)
POTASSIUM: 4.3 mmol/L (ref 3.5–5.1)
Sodium: 137 mmol/L (ref 135–145)
TOTAL PROTEIN: 6.4 g/dL — AB (ref 6.5–8.1)

## 2015-06-29 LAB — URINE MICROSCOPIC-ADD ON: RBC / HPF: NONE SEEN RBC/hpf (ref 0–5)

## 2015-06-29 LAB — CBC WITH DIFFERENTIAL/PLATELET
Basophils Absolute: 0 10*3/uL (ref 0.0–0.1)
Basophils Relative: 1 %
Eosinophils Absolute: 0.3 10*3/uL (ref 0.0–0.7)
Eosinophils Relative: 5 %
HEMATOCRIT: 42.6 % (ref 39.0–52.0)
Hemoglobin: 14.4 g/dL (ref 13.0–17.0)
LYMPHS PCT: 29 %
Lymphs Abs: 1.7 10*3/uL (ref 0.7–4.0)
MCH: 33 pg (ref 26.0–34.0)
MCHC: 33.8 g/dL (ref 30.0–36.0)
MCV: 97.5 fL (ref 78.0–100.0)
MONO ABS: 0.5 10*3/uL (ref 0.1–1.0)
MONOS PCT: 8 %
NEUTROS ABS: 3.4 10*3/uL (ref 1.7–7.7)
Neutrophils Relative %: 57 %
Platelets: 174 10*3/uL (ref 150–400)
RBC: 4.37 MIL/uL (ref 4.22–5.81)
RDW: 14.4 % (ref 11.5–15.5)
WBC: 5.9 10*3/uL (ref 4.0–10.5)

## 2015-06-29 LAB — CBG MONITORING, ED: Glucose-Capillary: 188 mg/dL — ABNORMAL HIGH (ref 65–99)

## 2015-06-29 NOTE — Discharge Instructions (Signed)
Hyperglycemia °Hyperglycemia occurs when the glucose (sugar) in your blood is too high. Hyperglycemia can happen for many reasons, but it most often happens to people who do not know they have diabetes or are not managing their diabetes properly.  °CAUSES  °Whether you have diabetes or not, there are other causes of hyperglycemia. Hyperglycemia can occur when you have diabetes, but it can also occur in other situations that you might not be as aware of, such as: °Diabetes °· If you have diabetes and are having problems controlling your blood glucose, hyperglycemia could occur because of some of the following reasons: °¨ Not following your meal plan. °¨ Not taking your diabetes medications or not taking it properly. °¨ Exercising less or doing less activity than you normally do. °¨ Being sick. °Pre-diabetes °· This cannot be ignored. Before people develop Type 2 diabetes, they almost always have "pre-diabetes." This is when your blood glucose levels are higher than normal, but not yet high enough to be diagnosed as diabetes. Research has shown that some long-term damage to the body, especially the heart and circulatory system, may already be occurring during pre-diabetes. If you take action to manage your blood glucose when you have pre-diabetes, you may delay or prevent Type 2 diabetes from developing. °Stress °· If you have diabetes, you may be "diet" controlled or on oral medications or insulin to control your diabetes. However, you may find that your blood glucose is higher than usual in the hospital whether you have diabetes or not. This is often referred to as "stress hyperglycemia." Stress can elevate your blood glucose. This happens because of hormones put out by the body during times of stress. If stress has been the cause of your high blood glucose, it can be followed regularly by your caregiver. That way he/she can make sure your hyperglycemia does not continue to get worse or progress to  diabetes. °Steroids °· Steroids are medications that act on the infection fighting system (immune system) to block inflammation or infection. One side effect can be a rise in blood glucose. Most people can produce enough extra insulin to allow for this rise, but for those who cannot, steroids make blood glucose levels go even higher. It is not unusual for steroid treatments to "uncover" diabetes that is developing. It is not always possible to determine if the hyperglycemia will go away after the steroids are stopped. A special blood test called an A1c is sometimes done to determine if your blood glucose was elevated before the steroids were started. °SYMPTOMS °· Thirsty. °· Frequent urination. °· Dry mouth. °· Blurred vision. °· Tired or fatigue. °· Weakness. °· Sleepy. °· Tingling in feet or leg. °DIAGNOSIS  °Diagnosis is made by monitoring blood glucose in one or all of the following ways: °· A1c test. This is a chemical found in your blood. °· Fingerstick blood glucose monitoring. °· Laboratory results. °TREATMENT  °First, knowing the cause of the hyperglycemia is important before the hyperglycemia can be treated. Treatment may include, but is not be limited to: °· Education. °· Change or adjustment in medications. °· Change or adjustment in meal plan. °· Treatment for an illness, infection, etc. °· More frequent blood glucose monitoring. °· Change in exercise plan. °· Decreasing or stopping steroids. °· Lifestyle changes. °HOME CARE INSTRUCTIONS  °· Test your blood glucose as directed. °· Exercise regularly. Your caregiver will give you instructions about exercise. Pre-diabetes or diabetes which comes on with stress is helped by exercising. °· Eat wholesome,   balanced meals. Eat often and at regular, fixed times. Your caregiver or nutritionist will give you a meal plan to guide your sugar intake. °· Being at an ideal weight is important. If needed, losing as little as 10 to 15 pounds may help improve blood  glucose levels. °SEEK MEDICAL CARE IF:  °· You have questions about medicine, activity, or diet. °· You continue to have symptoms (problems such as increased thirst, urination, or weight gain). °SEEK IMMEDIATE MEDICAL CARE IF:  °· You are vomiting or have diarrhea. °· Your breath smells fruity. °· You are breathing faster or slower. °· You are very sleepy or incoherent. °· You have numbness, tingling, or pain in your feet or hands. °· You have chest pain. °· Your symptoms get worse even though you have been following your caregiver's orders. °· If you have any other questions or concerns. °  °This information is not intended to replace advice given to you by your health care provider. Make sure you discuss any questions you have with your health care provider. °  °Document Released: 07/24/2000 Document Revised: 04/22/2011 Document Reviewed: 10/04/2014 °Elsevier Interactive Patient Education ©2016 [employer] ° °

## 2015-06-29 NOTE — ED Notes (Signed)
Lab at bedside

## 2015-06-29 NOTE — ED Provider Notes (Signed)
CSN: [id number]     Arrival date & time 06/29/15  1438 History   First MD Initiated Contact with Patient 06/29/15 1535     Chief Complaint  Patient presents with  . Medical Clearance   HPI Patient has a history of dementia. Patient's wife states the was first diagnosed about 7 years ago. She is very careful about leaving him alone. She also locks the door to help prevent him from leaving. This morning they both woke up.  She and her husband were going to take a nap so they each took half of Xanax. Patient woke up before she did and when she went to check the door he was gone. The patient drove to the home and that he grew up in about 15 miles away. She is not supposed to be driving. The wife was very upset because he has been a little bit more confused over the last few days. She called the primary doctor who suggested he come to the emergency room to be evaluated. Patient himself denies any complaints. He says that he was driving to his old home to check on his brother. He remembers that his mother passed away in the 90s.  Patient did have a fall yesterday bruising his lower back. He denies any complaints associated with that. He has not hit his head. He denies any fevers. Denies any chest pain or shortness of breath. No numbness or weakness. Past Medical History  Diagnosis Date  . Dementia   . Hypertension   . Diabetes mellitus   . Gout   . Vitamin B 12 deficiency    Past Surgical History  Procedure Laterality Date  . Cholecystectomy  2000    [facility]  . Cataract extraction w/phaco  11/18/2011    Procedure: CATARACT EXTRACTION PHACO AND INTRAOCULAR LENS PLACEMENT (IOC);  Surgeon: Susa Simmondsarroll F Haines, MD;  Location: [facility];  Service: Ophthalmology;  Laterality: Left;  CDE:74.35  . Cataract extraction w/phaco Right 03/30/2012    Procedure: CATARACT EXTRACTION PHACO AND INTRAOCULAR LENS PLACEMENT (IOC);  Surgeon: Susa Simmondsarroll F Haines, MD;  Location: [facility];  Service: Ophthalmology;  Laterality: Right;  CDE  74.16   History reviewed. No pertinent family history. Social History  Substance Use Topics  . Smoking status: Current Some Day Smoker -- 60 years    Types: Cigars  . Smokeless tobacco: None  . Alcohol Use: No    Review of Systems  All other systems reviewed and are negative.     Allergies  Penicillins  Home Medications   Prior to Admission medications   Medication Sig Start Date End Date Taking? Authorizing Provider  glipiZIDE (GLUCOTROL) 10 MG tablet Take 10 mg by mouth 2 (two) times daily before a meal.   Yes Historical Provider, MD  LANTUS SOLOSTAR 100 UNIT/ML Solostar Pen  06/16/15  Yes Historical Provider, MD  LORazepam (ATIVAN) 1 MG tablet Take 1 mg by mouth daily as needed. 06/10/15  Yes Historical Provider, MD  losartan (COZAAR) 25 MG tablet Take 25 mg by mouth every morning. 06/16/15  Yes Historical Provider, MD  metoprolol (LOPRESSOR) 50 MG tablet Take 50 mg by mouth 2 (two) times daily.   Yes Historical Provider, MD  pioglitazone (ACTOS) 15 MG tablet Take 15 mg by mouth daily. 06/26/15  Yes Historical Provider, MD   BP 146/59 mmHg  Pulse 75  Temp(Src) 98.5 F (36.9 C) (Oral)  Resp 16  Wt 81.784 kg  SpO2 99% Physical Exam  Constitutional: He is oriented to  person, place, and time. He appears well-developed and well-nourished. No distress.  HENT:  Head: Normocephalic and atraumatic.  Right Ear: External ear normal.  Left Ear: External ear normal.  Eyes: Conjunctivae are normal. Right eye exhibits no discharge. Left eye exhibits no discharge. No scleral icterus.  Neck: Neck supple. No tracheal deviation present.  Cardiovascular: Normal rate, regular rhythm and intact distal pulses.   Pulmonary/Chest: Effort normal and breath sounds normal. No stridor. No respiratory distress. He has no wheezes. He has no rales.  Abdominal: Soft. Bowel sounds are normal. He exhibits no distension. There is no tenderness. There is no rebound and no guarding.  Musculoskeletal: He  exhibits no edema or tenderness.  Large bruise of the lower back, no tenderness to palpation  Neurological: He is alert and oriented to person, place, and time. He has normal strength. No cranial nerve deficit (no facial droop, extraocular movements intact, no slurred speech) or sensory deficit. He exhibits normal muscle tone. He displays no seizure activity. Coordination normal.  No pronator drift, normal strength upper extremities and lower extremities, patient is aware that he is [facility]  Skin: Skin is warm and dry. No rash noted.  Psychiatric: He has a normal mood and affect.  Nursing note and vitals reviewed.   ED Course  Procedures (including critical care time) Labs Review Labs Reviewed  URINALYSIS, ROUTINE W REFLEX MICROSCOPIC (NOT AT [facility]) - Abnormal; Notable for the following:    Glucose, UA 100 (*)    Protein, ur 30 (*)    All other components within normal limits  COMPREHENSIVE METABOLIC PANEL - Abnormal; Notable for the following:    Glucose, Bld 239 (*)    BUN 23 (*)    Creatinine, Ser 1.75 (*)    Calcium 8.8 (*)    Total Protein 6.4 (*)    Albumin 3.3 (*)    GFR calc non Af Amer 35 (*)    GFR calc Af Amer 40 (*)    Anion gap 4 (*)    All other components within normal limits  URINE MICROSCOPIC-ADD ON - Abnormal; Notable for the following:    Squamous Epithelial / LPF 0-5 (*)    Bacteria, UA RARE (*)    All other components within normal limits  CBG MONITORING, ED - Abnormal; Notable for the following:    Glucose-Capillary 188 (*)    All other components within normal limits  CBC WITH DIFFERENTIAL/PLATELET    Imaging Review Dg Chest 2 View  06/29/2015  CLINICAL DATA:  Diabetes and high blood pressure EXAM: CHEST  2 VIEW COMPARISON:  None. FINDINGS: Normal mediastinum and cardiac silhouette. Normal pulmonary vasculature. No evidence of effusion, infiltrate, or pneumothorax. No acute bony abnormality. IMPRESSION: No acute cardiopulmonary process.  Electronically Signed   By: Genevive Bi M.D.   On: 06/29/2015 15:45   I have personally reviewed and evaluated these images and lab results as part of my medical decision-making.    MDM   Final diagnoses:  Dementia, without behavioral disturbance  Hyperglycemia    Pt has no complaints.  He is alert, following commands and is able to provide a clear history.  He is not delirous.  He doubt stroke.   Sx most likely related to his dementia.  Wife has plans to follow up with PCP.  Consider home health resources.    Linwood Dibbles, MD 06/29/15 [phone]

## 2015-06-29 NOTE — ED Notes (Signed)
Patient voided 15 mls yellow urine in urinal. Sent to lab for analysis.

## 2015-06-29 NOTE — ED Notes (Signed)
Wife states that patient has had increased delirium over the past couple days. Pt denies that pt says anything about pain.
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
## 2015-07-13 DIAGNOSIS — M109 Gout, unspecified: Secondary | ICD-10-CM | POA: Diagnosis not present

## 2015-07-13 DIAGNOSIS — I10 Essential (primary) hypertension: Secondary | ICD-10-CM | POA: Diagnosis not present

## 2015-07-13 DIAGNOSIS — E1121 Type 2 diabetes mellitus with diabetic nephropathy: Secondary | ICD-10-CM | POA: Diagnosis not present

## 2015-08-20 ENCOUNTER — Emergency Department (HOSPITAL_COMMUNITY): Payer: Medicare Other

## 2015-08-20 ENCOUNTER — Emergency Department (HOSPITAL_COMMUNITY)
Admission: EM | Admit: 2015-08-20 | Discharge: 2015-08-21 | Disposition: A | Discharge status: Home / Self Care | Payer: Medicare Other | Attending: Emergency Medicine | Admitting: Emergency Medicine

## 2015-08-20 ENCOUNTER — Encounter (HOSPITAL_COMMUNITY): Payer: Self-pay | Admitting: Emergency Medicine

## 2015-08-20 DIAGNOSIS — F0390 Unspecified dementia without behavioral disturbance: Secondary | ICD-10-CM | POA: Diagnosis not present

## 2015-08-20 DIAGNOSIS — I10 Essential (primary) hypertension: Secondary | ICD-10-CM | POA: Insufficient documentation

## 2015-08-20 DIAGNOSIS — W19XXXA Unspecified fall, initial encounter: Secondary | ICD-10-CM

## 2015-08-20 DIAGNOSIS — S299XXA Unspecified injury of thorax, initial encounter: Secondary | ICD-10-CM | POA: Diagnosis present

## 2015-08-20 DIAGNOSIS — F0391 Unspecified dementia with behavioral disturbance: Secondary | ICD-10-CM

## 2015-08-20 DIAGNOSIS — N289 Disorder of kidney and ureter, unspecified: Secondary | ICD-10-CM

## 2015-08-20 DIAGNOSIS — Z79899 Other long term (current) drug therapy: Secondary | ICD-10-CM | POA: Diagnosis not present

## 2015-08-20 DIAGNOSIS — E119 Type 2 diabetes mellitus without complications: Secondary | ICD-10-CM | POA: Insufficient documentation

## 2015-08-20 DIAGNOSIS — S2231XA Fracture of one rib, right side, initial encounter for closed fracture: Secondary | ICD-10-CM

## 2015-08-20 DIAGNOSIS — Y999 Unspecified external cause status: Secondary | ICD-10-CM | POA: Diagnosis not present

## 2015-08-20 DIAGNOSIS — Y939 Activity, unspecified: Secondary | ICD-10-CM | POA: Insufficient documentation

## 2015-08-20 DIAGNOSIS — Z794 Long term (current) use of insulin: Secondary | ICD-10-CM | POA: Diagnosis not present

## 2015-08-20 DIAGNOSIS — Y92008 Other place in unspecified non-institutional (private) residence as the place of occurrence of the external cause: Secondary | ICD-10-CM | POA: Diagnosis not present

## 2015-08-20 DIAGNOSIS — F17210 Nicotine dependence, cigarettes, uncomplicated: Secondary | ICD-10-CM | POA: Insufficient documentation

## 2015-08-20 DIAGNOSIS — S0990XA Unspecified injury of head, initial encounter: Secondary | ICD-10-CM | POA: Diagnosis not present

## 2015-08-20 LAB — CBC WITH DIFFERENTIAL/PLATELET
Basophils Absolute: 0 10*3/uL (ref 0.0–0.1)
Basophils Relative: 0 %
EOS ABS: 0.3 10*3/uL (ref 0.0–0.7)
EOS PCT: 3 %
HCT: 46 % (ref 39.0–52.0)
Hemoglobin: 15.5 g/dL (ref 13.0–17.0)
LYMPHS ABS: 1.5 10*3/uL (ref 0.7–4.0)
LYMPHS PCT: 19 %
MCH: 33 pg (ref 26.0–34.0)
MCHC: 33.7 g/dL (ref 30.0–36.0)
MCV: 97.9 fL (ref 78.0–100.0)
MONO ABS: 0.8 10*3/uL (ref 0.1–1.0)
MONOS PCT: 10 %
Neutro Abs: 5.5 10*3/uL (ref 1.7–7.7)
Neutrophils Relative %: 68 %
PLATELETS: 191 10*3/uL (ref 150–400)
RBC: 4.7 MIL/uL (ref 4.22–5.81)
RDW: 14 % (ref 11.5–15.5)
WBC: 8.1 10*3/uL (ref 4.0–10.5)

## 2015-08-20 LAB — COMPREHENSIVE METABOLIC PANEL
ALT: 29 U/L (ref 17–63)
ANION GAP: 7 (ref 5–15)
AST: 30 U/L (ref 15–41)
Albumin: 4 g/dL (ref 3.5–5.0)
Alkaline Phosphatase: 89 U/L (ref 38–126)
BUN: 21 mg/dL — ABNORMAL HIGH (ref 6–20)
CHLORIDE: 105 mmol/L (ref 101–111)
CO2: 26 mmol/L (ref 22–32)
CREATININE: 1.71 mg/dL — AB (ref 0.61–1.24)
Calcium: 9.3 mg/dL (ref 8.9–10.3)
GFR, EST AFRICAN AMERICAN: 41 mL/min — AB (ref >60)
GFR, EST NON AFRICAN AMERICAN: 36 mL/min — AB (ref >60)
Glucose, Bld: 69 mg/dL (ref 65–99)
POTASSIUM: 4.1 mmol/L (ref 3.5–5.1)
SODIUM: 138 mmol/L (ref 135–145)
Total Bilirubin: 1.2 mg/dL (ref 0.3–1.2)
Total Protein: 7.7 g/dL (ref 6.5–8.1)

## 2015-08-20 LAB — URINALYSIS, ROUTINE W REFLEX MICROSCOPIC
BILIRUBIN URINE: NEGATIVE
Glucose, UA: NEGATIVE mg/dL
Hgb urine dipstick: NEGATIVE
Ketones, ur: NEGATIVE mg/dL
LEUKOCYTES UA: NEGATIVE
NITRITE: NEGATIVE
Protein, ur: 30 mg/dL — AB
SPECIFIC GRAVITY, URINE: 1.015 (ref 1.005–1.030)
pH: 6 (ref 5.0–8.0)

## 2015-08-20 LAB — CBG MONITORING, ED
GLUCOSE-CAPILLARY: 52 mg/dL — AB (ref 65–99)
Glucose-Capillary: 88 mg/dL (ref 65–99)
Glucose-Capillary: 89 mg/dL (ref 65–99)
Glucose-Capillary: 80 mg/dL (ref 65–99)

## 2015-08-20 LAB — URINE MICROSCOPIC-ADD ON

## 2015-08-20 MED ORDER — LORAZEPAM 1 MG PO TABS
1.0000 mg | ORAL_TABLET | Freq: Three times a day (TID) | ORAL | Status: DC | PRN
  Administered 2015-08-21 (×2): 1 mg via ORAL
  Filled 2015-08-20 (×2): qty 1

## 2015-08-20 MED ORDER — HYDROCODONE-ACETAMINOPHEN 5-325 MG PO TABS: 1.0000 | ORAL_TABLET | Freq: Four times a day (QID) | ORAL | Status: DC | PRN

## 2015-08-20 MED ORDER — ACETAMINOPHEN 325 MG PO TABS: 650.0000 mg | ORAL_TABLET | Freq: Four times a day (QID) | ORAL | Status: DC | PRN

## 2015-08-20 MED ORDER — LOSARTAN POTASSIUM 25 MG PO TABS
25.0000 mg | ORAL_TABLET | Freq: Every morning | ORAL | Status: DC
  Administered 2015-08-21: 25 mg via ORAL
  Filled 2015-08-20 (×4): qty 1

## 2015-08-20 MED ORDER — METOPROLOL TARTRATE 50 MG PO TABS
50.0000 mg | ORAL_TABLET | Freq: Two times a day (BID) | ORAL | Status: DC
  Administered 2015-08-20 – 2015-08-21 (×2): 50 mg via ORAL
  Filled 2015-08-20 (×2): qty 1

## 2015-08-20 NOTE — ED Notes (Signed)
Holding

## 2015-08-20 NOTE — ED Notes (Signed)
Pt provided drink/snack at this time. 
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
## 2015-08-20 NOTE — ED Notes (Signed)
Pt used incentive spirometer 10 times to the maximum of 2500  

## 2015-08-20 NOTE — ED Notes (Signed)
Pt awake and used incentive spirometry 10 times with increase to 2500.

## 2015-08-20 NOTE — ED Notes (Signed)
Pt family member expresses that she doesn't feel like she can take care of her spouse at home. He has had increased amount of falls for the last week.

## 2015-08-20 NOTE — ED Notes (Signed)
Pt sleeping at this time. Equal rise and fall of chest is noted.  

## 2015-08-20 NOTE — ED Provider Notes (Signed)
History  By signing my name below, I, Charles Scott, attest that this documentation has been prepared under the direction and in the presence of Charles Rhine, MD. Electronically Signed: Earmon Scott, ED Scribe. 08/20/2015. 1:33 PM.  Chief Complaint  Patient presents with  . Fall   LEVEL 5 CAVEAT- Full history could not be obtained due to dementia.  Patient is a 82 y.o. male presenting with fall. The history is provided by the patient and the spouse. No language interpreter was used.  Fall This is a recurrent problem. The current episode started more than 1 week ago. The problem occurs every several days. The problem has not changed since onset.Pertinent negatives include no chest pain, no abdominal pain, no headaches and no shortness of breath. Nothing aggravates the symptoms. Nothing relieves the symptoms. He has tried nothing for the symptoms.    HPI Comments:  Charles Scott is a 82 y.o. male with PMHx of dementia, HTN, DM and gout who presents to the Emergency Department complaining of 7 falls in the past ten days per wife. He fell twice yesterday, hitting his head on the toilet at one point. He reports contusions to the right side of his abdomen and abrasions to the RUE that he sustained from a fall 5-6 days ago. Wife states he is very unsteady on his feet. He has not received any treatment of his symptoms. He denies modifying factors. He denies abdominal pain, nausea, vomiting, dizziness, LOC, CP, SOB, numbness, tingling or weakness of any extremity.   Past Medical History  Diagnosis Date  . Dementia   . Hypertension   . Diabetes mellitus   . Gout   . Vitamin B 12 deficiency    Past Surgical History  Procedure Laterality Date  . Cholecystectomy  2000    [facility]  . Cataract extraction w/phaco  11/18/2011    Procedure: CATARACT EXTRACTION PHACO AND INTRAOCULAR LENS PLACEMENT (IOC);  Surgeon: Susa Simmonds, MD;  Location: [facility];  Service: Ophthalmology;  Laterality: Left;   CDE:74.35  . Cataract extraction w/phaco Right 03/30/2012    Procedure: CATARACT EXTRACTION PHACO AND INTRAOCULAR LENS PLACEMENT (IOC);  Surgeon: Susa Simmonds, MD;  Location: [facility];  Service: Ophthalmology;  Laterality: Right;  CDE 74.16   History reviewed. No pertinent family history. Social History  Substance Use Topics  . Smoking status: Current Some Day Smoker -- 60 years    Types: Cigars  . Smokeless tobacco: Never Used  . Alcohol Use: No    LEVEL 5 CAVEAT- Full history could not be obtained due to dementia.   Review of Systems  Unable to perform ROS: Dementia  Respiratory: Negative for shortness of breath.   Cardiovascular: Negative for chest pain.  Gastrointestinal: Negative for nausea, vomiting and abdominal pain.  Skin: Positive for color change.  Neurological: Negative for syncope, weakness and headaches.   Allergies  Penicillins  Home Medications   Prior to Admission medications   Medication Sig Start Date End Date Taking? Authorizing Provider  glipiZIDE (GLUCOTROL) 10 MG tablet Take 10 mg by mouth 2 (two) times daily before a meal.   Yes Historical Provider, MD  LANTUS SOLOSTAR 100 UNIT/ML Solostar Pen Inject 70 Units into the skin daily at 10 pm.  06/16/15  Yes Historical Provider, MD  LORazepam (ATIVAN) 1 MG tablet Take 2 mg by mouth daily as needed for sleep.  06/10/15  Yes Historical Provider, MD  losartan (COZAAR) 25 MG tablet Take 25 mg by mouth every morning. 06/16/15  Yes Historical Provider, MD  metoprolol (LOPRESSOR) 50 MG tablet Take 50 mg by mouth 2 (two) times daily.   Yes Historical Provider, MD  pioglitazone (ACTOS) 15 MG tablet Take 15 mg by mouth daily. 06/26/15  Yes Historical Provider, MD   Triage Vitals: BP 156/79 mmHg  Pulse 67  Temp(Src) 98.1 F (36.7 C) (Oral)  Resp 18  Ht  (1.702 m)  Wt 180 lb (81.647 kg)  BMI 28.19 kg/m2  SpO2 100%  Physical Exam  CONSTITUTIONAL: Elderly HEAD: Normocephalic/atraumatic EYES: EOMI/PERRL ENMT:  Mucous membranes moist NECK: supple no meningeal signs SPINE/BACK:entire spine nontender CV: S1/S2 noted, no murmurs/rubs/gallops noted LUNGS: Lungs are clear to auscultation bilaterally, no apparent distress CHEST: tenderness and bruising to right lower ribs. No crepitus. ABDOMEN: soft, nontender, no rebound or guarding, bowel sounds noted throughout abdomen; bruising to right lower abdomen but no focal tenderness GU:no cva tenderness NEURO: Pt is awake/alert, mildly confused; moves all extremitiesx4.  No facial droop.   EXTREMITIES: pulses normal/equal, full ROM; All extremities/joints palpated/ranged and nontender SKIN: warm, color normal PSYCH: flat affect   ED Course  Procedures  DIAGNOSTIC STUDIES: Oxygen Saturation is 100% on RA, normal by my interpretation.   COORDINATION OF CARE: 1:27 PM- Will wait for labs and CXR to result. Will CT head. Wife verbalizes that she needs help with placement of pt in a nursing home. Family verbalizes understanding and agrees to plan. 3:20 PM Pt with h/o dementia with increasing falls He has isolated rib fracture No signs of spinal injury and no abd tenderness He is well appearing at rest He may be overmedicated with his diabetic meds as he is on lantus and oral meds and initial glucose low here Will stop diabetic meds He has hit his head during the recent falls, he would benefit from CT head to r/o traumatic brain injury Wife states she can not take him home and she is no longer able to care for him Will need case management and social work consult on 7/10 for placement Wife states he is a veteran patient and she is hoping for [facility] placement   DEFINITIVE FRACTURE CARE - RIB FRACTURE Pt with isolated right rib fracture Pain control, incentive spirometer.   Medications  losartan (COZAAR) tablet 25 mg (25 mg Oral Not Given 08/20/15 1351)  metoprolol (LOPRESSOR) tablet 50 mg (50 mg Oral Not Given 08/20/15 1350)  LORazepam (ATIVAN) tablet 1 mg  (not administered)    Labs Review Labs Reviewed  COMPREHENSIVE METABOLIC PANEL - Abnormal; Notable for the following:    BUN 21 (*)    Creatinine, Ser 1.71 (*)    GFR calc non Af Amer 36 (*)    GFR calc Af Amer 41 (*)    All other components within normal limits  URINALYSIS, ROUTINE W REFLEX MICROSCOPIC (NOT AT [facility]) - Abnormal; Notable for the following:    Protein, ur 30 (*)    All other components within normal limits  URINE MICROSCOPIC-ADD ON - Abnormal; Notable for the following:    Squamous Epithelial / LPF 0-5 (*)    Bacteria, UA FEW (*)    All other components within normal limits  CBG MONITORING, ED - Abnormal; Notable for the following:    Glucose-Capillary 52 (*)    All other components within normal limits  CBC WITH DIFFERENTIAL/PLATELET  CBG MONITORING, ED    Imaging Review Dg Ribs Unilateral W/chest Right  08/20/2015  CLINICAL DATA:  Recurrent falls with right chest wall pain. EXAM:  RIGHT RIBS AND CHEST - 3+ VIEW COMPARISON:  06/29/2015 chest radiograph. FINDINGS: Stable cardiomediastinal silhouette with normal heart size. No pneumothorax. No pleural effusion. No pulmonary edema. Low lung volumes with mild bibasilar atelectasis. Acute anterior right eighth rib fracture with minimal 2 mm anterior displacement of the anterior fracture fragment. No additional fracture or focal osseous lesion seen in the right ribs. IMPRESSION: 1. Acute minimally displaced anterior right eighth rib fracture. No pneumothorax. 2. Low lung volumes with mild bibasilar atelectasis. Electronically Signed   By: Delbert PhenixJason A Poff M.D.   On: 08/20/2015 13:27   I have personally reviewed and evaluated these images and lab results as part of my medical decision-making.   EKG Interpretation   Date/Time:  Sunday August 20 2015 13:27:11 EDT Ventricular Rate:  71 PR Interval:    QRS Duration: 94 QT Interval:  388 QTC Calculation: 422 R Axis:   25 Text Interpretation:  Sinus rhythm Posterior infarct, old  Borderline T  abnormalities, inferior leads No significant change since last tracing  Confirmed by Bebe ShaggyWICKLINE  MD, Chermaine Schnyder ([id number]) on 08/20/2015 1:36:16 PM      MDM   Final diagnoses:  Fall, initial encounter  Rib fracture, right, closed, initial encounter  Dementia, with behavioral disturbance    Nursing notes including past medical history and social history reviewed and considered in documentation Labs/vital reviewed myself and considered during evaluation   I personally performed the services described in this documentation, which was scribed in my presence. The recorded information has been reviewed and is accurate.       Charles Rhineonald Unknown Schleyer, MD 08/20/15 1524

## 2015-08-20 NOTE — ED Notes (Signed)
Pt sleeping at this time.   Incentive spirometer at bedside for when patient wakes up.

## 2015-08-20 NOTE — ED Notes (Signed)
WILLIE (Spouse) would like to be contacted before social work comes in [employer].   [phone]

## 2015-08-20 NOTE — ED Notes (Signed)
Pt sleeping comfortably, VS stable.

## 2015-08-20 NOTE — ED Notes (Signed)
Holding blood sugar medications.    Pt is alert and oriented. Being given a food tray.

## 2015-08-20 NOTE — ED Notes (Signed)
Wife pulled triage nurse aside and states that she wants patient admitted and "placed somewhere."

## 2015-08-20 NOTE — ED Notes (Signed)
Pt eating meal tray 

## 2015-08-20 NOTE — ED Notes (Signed)
Family at bedside. 

## 2015-08-20 NOTE — ED Notes (Signed)
Pt has eaten aprrox 90% of his meal tray. Pts family left bedside.

## 2015-08-20 NOTE — ED Notes (Signed)
MD at bedside. 

## 2015-08-20 NOTE — ED Notes (Signed)
Pt used incentive spirometer 10 times reaching 2250 each time.

## 2015-08-21 DIAGNOSIS — Z794 Long term (current) use of insulin: Secondary | ICD-10-CM | POA: Diagnosis not present

## 2015-08-21 DIAGNOSIS — M25569 Pain in unspecified knee: Secondary | ICD-10-CM | POA: Diagnosis not present

## 2015-08-21 DIAGNOSIS — R2681 Unsteadiness on feet: Secondary | ICD-10-CM | POA: Diagnosis not present

## 2015-08-21 DIAGNOSIS — N289 Disorder of kidney and ureter, unspecified: Secondary | ICD-10-CM | POA: Diagnosis not present

## 2015-08-21 DIAGNOSIS — R278 Other lack of coordination: Secondary | ICD-10-CM | POA: Diagnosis not present

## 2015-08-21 DIAGNOSIS — S2231XA Fracture of one rib, right side, initial encounter for closed fracture: Secondary | ICD-10-CM | POA: Diagnosis not present

## 2015-08-21 DIAGNOSIS — M23369 Other meniscus derangements, other lateral meniscus, unspecified knee: Secondary | ICD-10-CM | POA: Diagnosis not present

## 2015-08-21 DIAGNOSIS — R531 Weakness: Secondary | ICD-10-CM | POA: Diagnosis not present

## 2015-08-21 DIAGNOSIS — I159 Secondary hypertension, unspecified: Secondary | ICD-10-CM | POA: Diagnosis not present

## 2015-08-21 DIAGNOSIS — S81809A Unspecified open wound, unspecified lower leg, initial encounter: Secondary | ICD-10-CM | POA: Diagnosis not present

## 2015-08-21 DIAGNOSIS — I10 Essential (primary) hypertension: Secondary | ICD-10-CM | POA: Diagnosis not present

## 2015-08-21 DIAGNOSIS — E1122 Type 2 diabetes mellitus with diabetic chronic kidney disease: Secondary | ICD-10-CM | POA: Diagnosis not present

## 2015-08-21 DIAGNOSIS — S2239XA Fracture of one rib, unspecified side, initial encounter for closed fracture: Secondary | ICD-10-CM | POA: Diagnosis not present

## 2015-08-21 DIAGNOSIS — M25469 Effusion, unspecified knee: Secondary | ICD-10-CM | POA: Diagnosis not present

## 2015-08-21 DIAGNOSIS — E119 Type 2 diabetes mellitus without complications: Secondary | ICD-10-CM | POA: Diagnosis not present

## 2015-08-21 DIAGNOSIS — Z79899 Other long term (current) drug therapy: Secondary | ICD-10-CM | POA: Diagnosis not present

## 2015-08-21 DIAGNOSIS — Z9181 History of falling: Secondary | ICD-10-CM | POA: Diagnosis not present

## 2015-08-21 DIAGNOSIS — R451 Restlessness and agitation: Secondary | ICD-10-CM | POA: Diagnosis not present

## 2015-08-21 DIAGNOSIS — S2231XD Fracture of one rib, right side, subsequent encounter for fracture with routine healing: Secondary | ICD-10-CM | POA: Diagnosis not present

## 2015-08-21 DIAGNOSIS — Z7409 Other reduced mobility: Secondary | ICD-10-CM | POA: Diagnosis not present

## 2015-08-21 DIAGNOSIS — F0391 Unspecified dementia with behavioral disturbance: Secondary | ICD-10-CM | POA: Diagnosis not present

## 2015-08-21 DIAGNOSIS — M6530 Trigger finger, unspecified finger: Secondary | ICD-10-CM | POA: Diagnosis not present

## 2015-08-21 DIAGNOSIS — E118 Type 2 diabetes mellitus with unspecified complications: Secondary | ICD-10-CM | POA: Diagnosis not present

## 2015-08-21 DIAGNOSIS — M79643 Pain in unspecified hand: Secondary | ICD-10-CM | POA: Diagnosis not present

## 2015-08-21 DIAGNOSIS — N183 Chronic kidney disease, stage 3 (moderate): Secondary | ICD-10-CM | POA: Diagnosis not present

## 2015-08-21 LAB — CBG MONITORING, ED
Glucose-Capillary: 177 mg/dL — ABNORMAL HIGH (ref 65–99)
Glucose-Capillary: 153 mg/dL — ABNORMAL HIGH (ref 65–99)
Glucose-Capillary: 275 mg/dL — ABNORMAL HIGH (ref 65–99)

## 2015-08-21 MED ORDER — HALOPERIDOL 5 MG PO TABS
ORAL_TABLET | ORAL | Status: AC
  Administered 2015-08-21: 5 mg via ORAL
  Filled 2015-08-21: qty 1

## 2015-08-21 MED ORDER — HALOPERIDOL 5 MG PO TABS
5.0000 mg | ORAL_TABLET | Freq: Once | ORAL | Status: AC
  Administered 2015-08-21: 5 mg via ORAL

## 2015-08-21 MED ORDER — HALOPERIDOL LACTATE 5 MG/ML IJ SOLN
5.0000 mg | Freq: Once | INTRAMUSCULAR | Status: DC
  Filled 2015-08-21: qty 1

## 2015-08-21 NOTE — ED Notes (Signed)
Pt resting calmly w/ eyes closed. Rise & fall of the chest noted. Bed in low position, side rails up x2. NAD noted at this time.  

## 2015-08-21 NOTE — ED Notes (Signed)
Pt becoming agitated. Pt saying he wants to pay his bill and leave. Pt wants to call his wife to come & pick him up. Pt does not understand why he can't go home. He told this nurse he has people in high places that can get things changed around here.  Pt redirected placed back in the bed& warm blanket given.  Incentive spirometer used before leaving the room. Maximum volume 2000.

## 2015-08-21 NOTE — ED Notes (Signed)
Social worker aware of consult

## 2015-08-21 NOTE — ED Notes (Signed)
Pt fell in floor, found by other staff. Pt assisted back into bed. Abrasion to the left elbow cleaned & dressed. Pt still trying to leave, states he going to die. Pt keeps asking to pay his bill & leave. Says "I want be able to talk to anyone in the morning because he going to be dead." Pt assured we are watching him & nothing going to happen while he is here.

## 2015-08-21 NOTE — ED Notes (Signed)
Patient dressed and waiting for acceptance to [facility]. Family member at bedside.

## 2015-08-21 NOTE — ED Notes (Signed)
[facility] evaluation complete at this time, safety sitter remains at bedside.

## 2015-08-21 NOTE — ED Notes (Signed)
Patient remains calm, and able to be verbally directed. Patient continues to be alert to self, but disoriented to place and situation.

## 2015-08-21 NOTE — NC FL2 (Signed)
[place] MEDICAID FL2 LEVEL OF CARE SCREENING TOOL     IDENTIFICATION  Patient Name: Charles CraftsJohn M Scott Birthdate: 02/06/1931 Sex: male Admission Date (Current Location): 08/20/2015  [facility] [place]  [place]   Facility and Address:  [facility],  [address], [place] [id number]      Provider Number: [id number]  Attending Physician Name and Address:  Provider Default, MD  Relative Name and Phone Number:       Current Level of Care: Other (Comment) (Patient is in the ED for placement. He is not being admitted. ) Recommended Level of Care: Skilled Nursing Facility Prior Approval Number:    Date Approved/Denied:   PASRR Number:  [id number])  Discharge Plan: SNF    Current Diagnoses: Patient Active Problem List   Diagnosis Date Noted  . TRIGGER FINGER 05/31/2008  . HAND PAIN 05/31/2008  . DERANGEMENT MENISCUS 06/18/2007  . JOINT EFFUSION, KNEE 06/18/2007  . KNEE PAIN 06/18/2007    Orientation RESPIRATION BLADDER Height & Weight     Self    Incontinent Weight: 180 lb (81.647 kg) Height:  5\' 7"  (170.2 cm)  BEHAVIORAL SYMPTOMS/MOOD NEUROLOGICAL BOWEL NUTRITION STATUS      Continent    AMBULATORY STATUS COMMUNICATION OF NEEDS Skin   Extensive Assist Verbally Skin abrasions (Skin tears on arms due to fall)                       Personal Care Assistance Level of Assistance  Bathing, Dressing, Feeding Bathing Assistance: Maximum assistance Feeding assistance: Limited assistance Dressing Assistance: Maximum assistance     Functional Limitations Info  Sight, Hearing, Speech Sight Info: Adequate Hearing Info: Adequate Speech Info: Adequate    SPECIAL CARE FACTORS FREQUENCY                       Contractures      Additional Factors Info  Allergies, Psychotropic   Allergies Info:  (Penicillins)           Current Medications (08/21/2015):  This is the current hospital active medication list Current  Facility-Administered Medications  Medication Dose Route Frequency Provider Last Rate Last Dose  . acetaminophen (TYLENOL) tablet 650 mg  650 mg Oral Q6H PRN Zadie Rhineonald Wickline, MD      . HYDROcodone-acetaminophen (NORCO/VICODIN) 5-325 MG per tablet 1 tablet  1 tablet Oral Q6H PRN Zadie Rhineonald Wickline, MD      . LORazepam (ATIVAN) tablet 1 mg  1 mg Oral TID PRN Zadie Rhineonald Wickline, MD   1 mg at 08/21/15 1008  . losartan (COZAAR) tablet 25 mg  25 mg Oral q morning - 10a Zadie Rhineonald Wickline, MD   25 mg at 08/21/15 [id number]  . metoprolol (LOPRESSOR) tablet 50 mg  50 mg Oral BID Zadie Rhineonald Wickline, MD   50 mg at 08/21/15 [id number]   Current Outpatient Prescriptions  Medication Sig Dispense Refill  . glipiZIDE (GLUCOTROL) 10 MG tablet Take 10 mg by mouth 2 (two) times daily before a meal.    . LANTUS SOLOSTAR 100 UNIT/ML Solostar Pen Inject 70 Units into the skin daily at 10 pm.     . LORazepam (ATIVAN) 1 MG tablet Take 2 mg by mouth daily as needed for sleep.     Marland Kitchen. losartan (COZAAR) 25 MG tablet Take 25 mg by mouth every morning.    . metoprolol (LOPRESSOR) 50 MG tablet Take 50 mg by mouth 2 (two) times daily.    . pioglitazone (  ACTOS) 15 MG tablet Take 15 mg by mouth daily.       Discharge Medications: Please see discharge summary for a list of discharge medications.  Relevant Imaging Results:  Relevant Lab Results:   Additional Information    Arzu Mcgaughey, Juleen China, LCSW

## 2015-08-21 NOTE — ED Notes (Signed)
Pt required assist of 2 staff members to BR. Pt leans back when standing and shuffles feet. Requires max verbal cues to transfer. Oriented to self and knows DOB. Pt is cooperative

## 2015-08-21 NOTE — ED Notes (Addendum)
Pt continues to sit up in the bed and try to slid self down in bed, asking for shoes. Redirected multiple times

## 2015-08-21 NOTE — Clinical Social Work Placement (Signed)
   CLINICAL SOCIAL WORK PLACEMENT  NOTE  Date:  08/21/2015  Patient Details  Name: Charles CraftsJohn M Milburn MRN: [id number] Date of Birth: 04/14/1930  Clinical Social Work is seeking post-discharge placement for this patient at the Skilled  Nursing Facility level of care (*CSW will initial, date and re-position this form in  chart as items are completed):  Yes   Patient/family provided with [facility] Clinical Social Work Department's list of facilities offering this level of care within the geographic area requested by the patient (or if unable, by the patient's family).  Yes   Patient/family informed of their freedom to choose among providers that offer the needed level of care, that participate in Medicare, Medicaid or managed care program needed by the patient, have an available bed and are willing to accept the patient.  Yes   Patient/family informed of [facility]'s ownership interest in [facility] and [facility], as well as of the fact that they are under no obligation to receive care at these facilities.  PASRR submitted to EDS on 08/21/15     PASRR number received on 08/21/15     Existing PASRR number confirmed on       FL2 transmitted to all facilities in geographic area requested by pt/family on 08/21/15     FL2 transmitted to all facilities within larger geographic area on       Patient informed that his/her managed care company has contracts with or will negotiate with certain facilities, including the following:            Patient/family informed of bed offers received.  Patient chooses bed at       Physician recommends and patient chooses bed at      Patient to be transferred to   on  .  Patient to be transferred to facility by       Patient family notified on   of transfer.  Name of family member notified:        PHYSICIAN       Additional Comment:    _______________________________________________ Annice NeedySettle, Elowen Debruyn D, LCSW 08/21/2015, 1:22 PM

## 2015-08-21 NOTE — ED Notes (Signed)
Pt used incentive spirometer 10 times to the maximum of 2500

## 2015-08-21 NOTE — Discharge Instructions (Signed)
Please get situated at the skilled nursing facility.

## 2015-08-21 NOTE — ED Notes (Signed)
Debbie from [facility] here to do evaluation

## 2015-08-21 NOTE — ED Notes (Signed)
Patient provided Diet Coke at family member request. Patient tolerating PO fluids well. Family member remains at bedside.

## 2015-08-21 NOTE — ED Notes (Signed)
Safety sitter sitting with patient

## 2015-08-21 NOTE — ED Notes (Signed)
Patient assisted to standing position at bedside to use urinal. Patient as unsteady balance, but follows verbal commands well.

## 2015-08-21 NOTE — ED Notes (Signed)
[facility] from [facility].

## 2015-08-21 NOTE — ED Notes (Signed)
Pt set up in bed & removed gripper socks again. Reminded pt that was one of the reason he fell last night. Pt states he thought his brother was with him but his is not. Pt still saying he wants to go home.

## 2015-08-21 NOTE — ED Provider Notes (Signed)
Nurse reports patient fell out of bed. He has a new abrasion on his left elbow. When I recheck patient he's able to move his elbow without complaints of pain. We attempted to give patient Haldol IM so he would rest and quit trying to get out of bed, however he refused an injection. He did finally agree to take a pill.  Devoria AlbeIva Arlo Butt, MD, [facility]   Roarke Marciano, MD 08/21/15 [phone]

## 2015-08-21 NOTE — Clinical Social Work Note (Signed)
Pt accepted at [facility]. Wife to provide transport.   Derenda FennelKara Maryruth Apple, LCSW [phone]

## 2015-08-21 NOTE — ED Notes (Signed)
Incentive spirometry performed

## 2015-08-21 NOTE — Clinical Social Work Note (Addendum)
Clinical Social Work Assessment  Patient Details  Name: Charles CraftsJohn M Scott MRN: [id number] Date of Birth: 06/11/1930  Date of referral:  08/21/15               Reason for consult:  Facility Placement                Permission sought to share information with:    Permission granted to share information::     Name::        Agency::     Relationship::     Contact Information:     Housing/Transportation Living arrangements for the past 2 months:  Single Family Home Source of Information:  Spouse Patient Interpreter Needed:  None Criminal Activity/Legal Involvement Pertinent to Current Situation/Hospitalization:  No - Comment as needed Significant Relationships:  Adult Children, Spouse Lives with:  Spouse Do you feel safe going back to the place where you live?  Yes Need for family participation in patient care:  Yes (Comment)  Care giving concerns:  Wife advises that she is unable to continue providing needed level of care. She is primary caretaker and has been sole caretaker for seven years.    Social Worker assessment / plan:  Patient's wife advised that patient has fallen eight times in 11 days. She advised that patient has broken a rib due to his falls. She advised that on 06/29/15 patient drove 15 miles to an old house desiring he use to live in because he wanted to see his mother who has been deceased for 27 years.  Patient's wife advised that [facility]. CSW contacted that [facility] and spoke with Eber Jonesarolyn. Eber JonesCarolyn advised that they are not a [facility] facility but are [facility] on [facility]. She indicated that the facility does not accept [facility]. CSW provided SNF list. Patient's wife advised that her choices were [facility], [facility] or [facility].  CSW faxed out clinicals.  Patient advised that patient's unable to walk without falling, however at baseline he is able to ambulate appropriately.   Employment status:  Retired Product/process development scientistnsurance information:  Managed  Medicare PT Recommendations:  Not assessed at this time Information / Referral to community resources:  Skilled Nursing Facility  Patient/Family's Response to care:  Patient's wife is agreeable to SNF.   Patient/Family's Understanding of and Emotional Response to Diagnosis, Current Treatment, and Prognosis:  Patient's wife understands patients diagnosis, treatment and prognosis and believe he can be more [facility] as he needs [facility].   Emotional Assessment Appearance:  Appears stated age Attitude/Demeanor/Rapport:   (Cooperative) Affect (typically observed):   (Cooperative) Orientation:  Oriented to Self, Oriented to Place, Oriented to  Time, Oriented to Situation Alcohol / Substance use:  Not Applicable Psych involvement (Current and /or in the community):  No (Comment)  Discharge Needs  Concerns to be addressed:  Discharge Planning Concerns Readmission within the last 30 days:  Yes Current discharge risk:  Chronically ill Barriers to Discharge:  No Barriers Identified   Charles NeedySettle, Charles Scott D, LCSW 08/21/2015, 1:23 PM

## 2015-08-21 NOTE — ED Notes (Signed)
Discharged to [facility] B[facility]-bed2

## 2015-08-21 NOTE — ED Notes (Signed)
Patient tolerated Meal tray feeding assisted by family member.

## 2015-09-04 DIAGNOSIS — S81809A Unspecified open wound, unspecified lower leg, initial encounter: Secondary | ICD-10-CM | POA: Diagnosis not present

## 2015-09-04 DIAGNOSIS — E1122 Type 2 diabetes mellitus with diabetic chronic kidney disease: Secondary | ICD-10-CM | POA: Diagnosis not present

## 2015-09-04 DIAGNOSIS — E119 Type 2 diabetes mellitus without complications: Secondary | ICD-10-CM | POA: Diagnosis not present

## 2015-09-11 DIAGNOSIS — S81809A Unspecified open wound, unspecified lower leg, initial encounter: Secondary | ICD-10-CM | POA: Diagnosis not present

## 2015-09-11 DIAGNOSIS — N183 Chronic kidney disease, stage 3 (moderate): Secondary | ICD-10-CM | POA: Diagnosis not present

## 2015-09-18 DIAGNOSIS — S81809A Unspecified open wound, unspecified lower leg, initial encounter: Secondary | ICD-10-CM | POA: Diagnosis not present

## 2015-09-18 DIAGNOSIS — E119 Type 2 diabetes mellitus without complications: Secondary | ICD-10-CM | POA: Diagnosis not present

## 2015-09-18 DIAGNOSIS — N183 Chronic kidney disease, stage 3 (moderate): Secondary | ICD-10-CM | POA: Diagnosis not present

## 2015-09-22 DIAGNOSIS — E119 Type 2 diabetes mellitus without complications: Secondary | ICD-10-CM | POA: Diagnosis not present

## 2015-09-22 DIAGNOSIS — Z7409 Other reduced mobility: Secondary | ICD-10-CM | POA: Diagnosis not present

## 2015-09-22 DIAGNOSIS — F0391 Unspecified dementia with behavioral disturbance: Secondary | ICD-10-CM | POA: Diagnosis not present

## 2015-09-22 DIAGNOSIS — R451 Restlessness and agitation: Secondary | ICD-10-CM | POA: Diagnosis not present

## 2015-09-26 DIAGNOSIS — R451 Restlessness and agitation: Secondary | ICD-10-CM | POA: Diagnosis not present

## 2015-09-26 DIAGNOSIS — F0391 Unspecified dementia with behavioral disturbance: Secondary | ICD-10-CM | POA: Diagnosis not present

## 2015-10-13 DIAGNOSIS — R296 Repeated falls: Secondary | ICD-10-CM | POA: Diagnosis not present

## 2015-10-13 DIAGNOSIS — Z794 Long term (current) use of insulin: Secondary | ICD-10-CM | POA: Diagnosis not present

## 2015-10-13 DIAGNOSIS — N183 Chronic kidney disease, stage 3 (moderate): Secondary | ICD-10-CM | POA: Diagnosis not present

## 2015-10-13 DIAGNOSIS — S2231XD Fracture of one rib, right side, subsequent encounter for fracture with routine healing: Secondary | ICD-10-CM | POA: Diagnosis not present

## 2015-10-13 DIAGNOSIS — Z9181 History of falling: Secondary | ICD-10-CM | POA: Diagnosis not present

## 2015-10-13 DIAGNOSIS — R2689 Other abnormalities of gait and mobility: Secondary | ICD-10-CM | POA: Diagnosis not present

## 2015-10-13 DIAGNOSIS — E1122 Type 2 diabetes mellitus with diabetic chronic kidney disease: Secondary | ICD-10-CM | POA: Diagnosis not present

## 2015-10-16 ENCOUNTER — Encounter (HOSPITAL_COMMUNITY): Payer: Self-pay

## 2015-10-16 ENCOUNTER — Emergency Department (HOSPITAL_COMMUNITY): Payer: Medicare Other

## 2015-10-16 ENCOUNTER — Inpatient Hospital Stay (HOSPITAL_COMMUNITY)
Admission: EM | Admit: 2015-10-16 | Discharge: 2015-10-24 | DRG: 092 | Payer: Medicare Other | Attending: Internal Medicine | Admitting: Internal Medicine

## 2015-10-16 DIAGNOSIS — J320 Chronic maxillary sinusitis: Secondary | ICD-10-CM | POA: Diagnosis present

## 2015-10-16 DIAGNOSIS — R0600 Dyspnea, unspecified: Secondary | ICD-10-CM | POA: Diagnosis not present

## 2015-10-16 DIAGNOSIS — F0391 Unspecified dementia with behavioral disturbance: Secondary | ICD-10-CM | POA: Diagnosis present

## 2015-10-16 DIAGNOSIS — N183 Chronic kidney disease, stage 3 (moderate): Secondary | ICD-10-CM | POA: Diagnosis present

## 2015-10-16 DIAGNOSIS — S51019A Laceration without foreign body of unspecified elbow, initial encounter: Secondary | ICD-10-CM | POA: Diagnosis not present

## 2015-10-16 DIAGNOSIS — Z7189 Other specified counseling: Secondary | ICD-10-CM | POA: Diagnosis not present

## 2015-10-16 DIAGNOSIS — K047 Periapical abscess without sinus: Secondary | ICD-10-CM

## 2015-10-16 DIAGNOSIS — E1310 Other specified diabetes mellitus with ketoacidosis without coma: Secondary | ICD-10-CM

## 2015-10-16 DIAGNOSIS — F17290 Nicotine dependence, other tobacco product, uncomplicated: Secondary | ICD-10-CM | POA: Diagnosis present

## 2015-10-16 DIAGNOSIS — G92 Toxic encephalopathy: Secondary | ICD-10-CM | POA: Diagnosis not present

## 2015-10-16 DIAGNOSIS — I129 Hypertensive chronic kidney disease with stage 1 through stage 4 chronic kidney disease, or unspecified chronic kidney disease: Secondary | ICD-10-CM | POA: Diagnosis not present

## 2015-10-16 DIAGNOSIS — I10 Essential (primary) hypertension: Secondary | ICD-10-CM

## 2015-10-16 DIAGNOSIS — E1165 Type 2 diabetes mellitus with hyperglycemia: Secondary | ICD-10-CM | POA: Diagnosis present

## 2015-10-16 DIAGNOSIS — Z66 Do not resuscitate: Secondary | ICD-10-CM | POA: Diagnosis present

## 2015-10-16 DIAGNOSIS — J9811 Atelectasis: Secondary | ICD-10-CM | POA: Diagnosis not present

## 2015-10-16 DIAGNOSIS — Z79899 Other long term (current) drug therapy: Secondary | ICD-10-CM

## 2015-10-16 DIAGNOSIS — D72829 Elevated white blood cell count, unspecified: Secondary | ICD-10-CM | POA: Diagnosis not present

## 2015-10-16 DIAGNOSIS — E870 Hyperosmolality and hypernatremia: Secondary | ICD-10-CM | POA: Diagnosis present

## 2015-10-16 DIAGNOSIS — Z961 Presence of intraocular lens: Secondary | ICD-10-CM | POA: Diagnosis present

## 2015-10-16 DIAGNOSIS — E1122 Type 2 diabetes mellitus with diabetic chronic kidney disease: Secondary | ICD-10-CM | POA: Diagnosis not present

## 2015-10-16 DIAGNOSIS — G9340 Encephalopathy, unspecified: Secondary | ICD-10-CM | POA: Diagnosis not present

## 2015-10-16 DIAGNOSIS — Z9049 Acquired absence of other specified parts of digestive tract: Secondary | ICD-10-CM | POA: Diagnosis not present

## 2015-10-16 DIAGNOSIS — M109 Gout, unspecified: Secondary | ICD-10-CM | POA: Diagnosis present

## 2015-10-16 DIAGNOSIS — R4182 Altered mental status, unspecified: Secondary | ICD-10-CM | POA: Diagnosis not present

## 2015-10-16 DIAGNOSIS — E1121 Type 2 diabetes mellitus with diabetic nephropathy: Secondary | ICD-10-CM | POA: Diagnosis not present

## 2015-10-16 DIAGNOSIS — Z9841 Cataract extraction status, right eye: Secondary | ICD-10-CM | POA: Diagnosis not present

## 2015-10-16 DIAGNOSIS — R918 Other nonspecific abnormal finding of lung field: Secondary | ICD-10-CM | POA: Diagnosis not present

## 2015-10-16 DIAGNOSIS — B954 Other streptococcus as the cause of diseases classified elsewhere: Secondary | ICD-10-CM | POA: Diagnosis present

## 2015-10-16 DIAGNOSIS — F0390 Unspecified dementia without behavioral disturbance: Secondary | ICD-10-CM | POA: Diagnosis present

## 2015-10-16 DIAGNOSIS — Z515 Encounter for palliative care: Secondary | ICD-10-CM | POA: Diagnosis not present

## 2015-10-16 DIAGNOSIS — R404 Transient alteration of awareness: Secondary | ICD-10-CM | POA: Diagnosis not present

## 2015-10-16 DIAGNOSIS — Z794 Long term (current) use of insulin: Secondary | ICD-10-CM

## 2015-10-16 DIAGNOSIS — W19XXXA Unspecified fall, initial encounter: Secondary | ICD-10-CM | POA: Diagnosis present

## 2015-10-16 DIAGNOSIS — F05 Delirium due to known physiological condition: Secondary | ICD-10-CM | POA: Diagnosis present

## 2015-10-16 DIAGNOSIS — Z9842 Cataract extraction status, left eye: Secondary | ICD-10-CM | POA: Diagnosis not present

## 2015-10-16 DIAGNOSIS — R5383 Other fatigue: Secondary | ICD-10-CM | POA: Diagnosis not present

## 2015-10-16 DIAGNOSIS — Z880 Allergy status to penicillin: Secondary | ICD-10-CM

## 2015-10-16 LAB — CBC WITH DIFFERENTIAL/PLATELET
BASOS ABS: 0 10*3/uL (ref 0.0–0.1)
Basophils Relative: 1 %
EOS ABS: 0.2 10*3/uL (ref 0.0–0.7)
EOS PCT: 3 %
HCT: 40.1 % (ref 39.0–52.0)
HEMOGLOBIN: 13.5 g/dL (ref 13.0–17.0)
LYMPHS ABS: 1 10*3/uL (ref 0.7–4.0)
Lymphocytes Relative: 13 %
MCH: 32.8 pg (ref 26.0–34.0)
MCHC: 33.7 g/dL (ref 30.0–36.0)
MCV: 97.3 fL (ref 78.0–100.0)
Monocytes Absolute: 0.7 10*3/uL (ref 0.1–1.0)
Monocytes Relative: 9 %
NEUTROS PCT: 74 %
Neutro Abs: 6 10*3/uL (ref 1.7–7.7)
PLATELETS: 171 10*3/uL (ref 150–400)
RBC: 4.12 MIL/uL — AB (ref 4.22–5.81)
RDW: 14.4 % (ref 11.5–15.5)
WBC: 8 10*3/uL (ref 4.0–10.5)

## 2015-10-16 LAB — URINALYSIS, ROUTINE W REFLEX MICROSCOPIC
Bilirubin Urine: NEGATIVE
Glucose, UA: 500 mg/dL — AB
HGB URINE DIPSTICK: NEGATIVE
Ketones, ur: NEGATIVE mg/dL
LEUKOCYTES UA: NEGATIVE
NITRITE: NEGATIVE
PROTEIN: NEGATIVE mg/dL
SPECIFIC GRAVITY, URINE: 1.016 (ref 1.005–1.030)
pH: 5 (ref 5.0–8.0)

## 2015-10-16 LAB — COMPREHENSIVE METABOLIC PANEL
ALT: 23 U/L (ref 17–63)
AST: 19 U/L (ref 15–41)
Albumin: 3.8 g/dL (ref 3.5–5.0)
Alkaline Phosphatase: 79 U/L (ref 38–126)
Anion gap: 7 (ref 5–15)
BILIRUBIN TOTAL: 0.6 mg/dL (ref 0.3–1.2)
BUN: 24 mg/dL — ABNORMAL HIGH (ref 6–20)
CHLORIDE: 108 mmol/L (ref 101–111)
CO2: 21 mmol/L — ABNORMAL LOW (ref 22–32)
CREATININE: 1.73 mg/dL — AB (ref 0.61–1.24)
Calcium: 9.2 mg/dL (ref 8.9–10.3)
GFR, EST AFRICAN AMERICAN: 41 mL/min — AB (ref >60)
GFR, EST NON AFRICAN AMERICAN: 35 mL/min — AB (ref >60)
Glucose, Bld: 349 mg/dL — ABNORMAL HIGH (ref 65–99)
POTASSIUM: 4.4 mmol/L (ref 3.5–5.1)
Sodium: 136 mmol/L (ref 135–145)
TOTAL PROTEIN: 6.8 g/dL (ref 6.5–8.1)

## 2015-10-16 LAB — AMMONIA: AMMONIA: 13 umol/L (ref 9–35)

## 2015-10-16 LAB — BLOOD GAS, VENOUS
Acid-base deficit: 1.3 mmol/L (ref 0.0–2.0)
Bicarbonate: 23.3 mmol/L (ref 20.0–28.0)
O2 SAT: 68.1 %
PCO2 VEN: 40.9 mmHg — AB (ref 44.0–60.0)
PH VEN: 7.374 (ref 7.250–7.430)
PO2 VEN: 36.2 mmHg (ref 32.0–45.0)
Patient temperature: 98.6

## 2015-10-16 LAB — CBG MONITORING, ED: GLUCOSE-CAPILLARY: 313 mg/dL — AB (ref 65–99)

## 2015-10-16 LAB — GLUCOSE, CAPILLARY
Glucose-Capillary: 81 mg/dL (ref 65–99)
Glucose-Capillary: 207 mg/dL — ABNORMAL HIGH (ref 65–99)

## 2015-10-16 LAB — I-STAT CG4 LACTIC ACID, ED: LACTIC ACID, VENOUS: 1.78 mmol/L (ref 0.5–1.9)

## 2015-10-16 LAB — MRSA PCR SCREENING: MRSA by PCR: NEGATIVE

## 2015-10-16 MED ORDER — SODIUM CHLORIDE 0.9% FLUSH
3.0000 mL | Freq: Two times a day (BID) | INTRAVENOUS | Status: DC
  Administered 2015-10-16 – 2015-10-23 (×8): 3 mL via INTRAVENOUS

## 2015-10-16 MED ORDER — ONDANSETRON HCL 4 MG/2ML IJ SOLN: 4.0000 mg | Freq: Four times a day (QID) | INTRAMUSCULAR | Status: DC | PRN

## 2015-10-16 MED ORDER — OLANZAPINE 10 MG IM SOLR
5.0000 mg | Freq: Every day | INTRAMUSCULAR | Status: DC
  Administered 2015-10-16 – 2015-10-19 (×3): 5 mg via INTRAMUSCULAR
  Filled 2015-10-16 (×5): qty 10

## 2015-10-16 MED ORDER — ACETAMINOPHEN 325 MG PO TABS
650.0000 mg | ORAL_TABLET | Freq: Four times a day (QID) | ORAL | Status: DC | PRN
  Administered 2015-10-23 – 2015-10-24 (×2): 650 mg via ORAL
  Filled 2015-10-16 (×2): qty 2

## 2015-10-16 MED ORDER — LORAZEPAM 1 MG PO TABS
1.0000 mg | ORAL_TABLET | Freq: Three times a day (TID) | ORAL | Status: DC | PRN
  Administered 2015-10-17 (×2): 1 mg via ORAL
  Filled 2015-10-16 (×2): qty 1

## 2015-10-16 MED ORDER — VANCOMYCIN HCL 10 G IV SOLR
1500.0000 mg | Freq: Once | INTRAVENOUS | Status: AC
  Administered 2015-10-16: 1500 mg via INTRAVENOUS
  Filled 2015-10-16: qty 1500

## 2015-10-16 MED ORDER — ENOXAPARIN SODIUM 40 MG/0.4ML ~~LOC~~ SOLN
40.0000 mg | SUBCUTANEOUS | Status: DC
  Administered 2015-10-16 – 2015-10-23 (×8): 40 mg via SUBCUTANEOUS
  Filled 2015-10-16 (×8): qty 0.4

## 2015-10-16 MED ORDER — TRAZODONE HCL 50 MG PO TABS
50.0000 mg | ORAL_TABLET | Freq: Every day | ORAL | Status: DC
  Administered 2015-10-16 – 2015-10-23 (×3): 50 mg via ORAL
  Filled 2015-10-16 (×4): qty 1

## 2015-10-16 MED ORDER — INSULIN ASPART 100 UNIT/ML ~~LOC~~ SOLN
0.0000 [IU] | SUBCUTANEOUS | Status: DC
  Administered 2015-10-16: 3 [IU] via SUBCUTANEOUS
  Administered 2015-10-17 (×2): 2 [IU] via SUBCUTANEOUS
  Administered 2015-10-17 (×2): 1 [IU] via SUBCUTANEOUS
  Administered 2015-10-17: 2 [IU] via SUBCUTANEOUS
  Administered 2015-10-17 – 2015-10-18 (×7): 1 [IU] via SUBCUTANEOUS
  Administered 2015-10-19: 2 [IU] via SUBCUTANEOUS
  Administered 2015-10-19: 5 [IU] via SUBCUTANEOUS
  Administered 2015-10-19 (×2): 2 [IU] via SUBCUTANEOUS
  Administered 2015-10-20: 1 [IU] via SUBCUTANEOUS
  Administered 2015-10-20 (×2): 2 [IU] via SUBCUTANEOUS
  Administered 2015-10-20: 1 [IU] via SUBCUTANEOUS
  Administered 2015-10-20: 2 [IU] via SUBCUTANEOUS
  Administered 2015-10-20: 3 [IU] via SUBCUTANEOUS
  Administered 2015-10-21: 5 [IU] via SUBCUTANEOUS
  Administered 2015-10-21: 3 [IU] via SUBCUTANEOUS
  Administered 2015-10-21: 1 [IU] via SUBCUTANEOUS
  Administered 2015-10-21 (×2): 2 [IU] via SUBCUTANEOUS
  Administered 2015-10-21: 3 [IU] via SUBCUTANEOUS
  Administered 2015-10-22: 2 [IU] via SUBCUTANEOUS
  Administered 2015-10-22: 3 [IU] via SUBCUTANEOUS
  Administered 2015-10-22 (×3): 2 [IU] via SUBCUTANEOUS
  Administered 2015-10-22: 1 [IU] via SUBCUTANEOUS
  Administered 2015-10-23 (×2): 2 [IU] via SUBCUTANEOUS
  Administered 2015-10-23: 1 [IU] via SUBCUTANEOUS
  Administered 2015-10-24 (×2): 2 [IU] via SUBCUTANEOUS
  Administered 2015-10-24: 1 [IU] via SUBCUTANEOUS

## 2015-10-16 MED ORDER — ONDANSETRON HCL 4 MG PO TABS: 4.0000 mg | ORAL_TABLET | Freq: Four times a day (QID) | ORAL | Status: DC | PRN

## 2015-10-16 MED ORDER — SODIUM CHLORIDE 0.9 % IV SOLN: 2.0000 g | Freq: Two times a day (BID) | INTRAVENOUS | Status: DC

## 2015-10-16 MED ORDER — ADULT MULTIVITAMIN W/MINERALS CH
1.0000 | ORAL_TABLET | Freq: Every day | ORAL | Status: DC
  Administered 2015-10-16 – 2015-10-19 (×3): 1 via ORAL
  Filled 2015-10-16 (×5): qty 1

## 2015-10-16 MED ORDER — SODIUM CHLORIDE 0.9 % IV SOLN
INTRAVENOUS | Status: DC
  Administered 2015-10-16 – 2015-10-17 (×2): via INTRAVENOUS

## 2015-10-16 MED ORDER — AZITHROMYCIN 250 MG PO TABS
500.0000 mg | ORAL_TABLET | ORAL | Status: DC
  Administered 2015-10-16 – 2015-10-19 (×3): 500 mg via ORAL
  Filled 2015-10-16 (×4): qty 2

## 2015-10-16 MED ORDER — SODIUM CHLORIDE 0.9 % IV BOLUS (SEPSIS)
2500.0000 mL | Freq: Once | INTRAVENOUS | Status: AC
  Administered 2015-10-16: 2500 mL via INTRAVENOUS

## 2015-10-16 MED ORDER — SODIUM CHLORIDE 0.9 % IV SOLN
2.0000 g | Freq: Once | INTRAVENOUS | Status: AC
  Administered 2015-10-16: 2 g via INTRAVENOUS
  Filled 2015-10-16: qty 2

## 2015-10-16 MED ORDER — METOPROLOL TARTRATE 50 MG PO TABS
50.0000 mg | ORAL_TABLET | Freq: Two times a day (BID) | ORAL | Status: DC
  Administered 2015-10-16 – 2015-10-19 (×4): 50 mg via ORAL
  Filled 2015-10-16 (×7): qty 1

## 2015-10-16 MED ORDER — SALINE SPRAY 0.65 % NA SOLN
1.0000 | NASAL | Status: DC | PRN
  Filled 2015-10-16: qty 44

## 2015-10-16 MED ORDER — LOSARTAN POTASSIUM 50 MG PO TABS
25.0000 mg | ORAL_TABLET | Freq: Every morning | ORAL | Status: DC
  Administered 2015-10-17 – 2015-10-19 (×2): 25 mg via ORAL
  Filled 2015-10-16 (×4): qty 1

## 2015-10-16 MED ORDER — VITAMIN B-1 100 MG PO TABS
100.0000 mg | ORAL_TABLET | Freq: Every day | ORAL | Status: DC
  Administered 2015-10-16 – 2015-10-19 (×3): 100 mg via ORAL
  Filled 2015-10-16 (×3): qty 1

## 2015-10-16 MED ORDER — FLUTICASONE PROPIONATE 50 MCG/ACT NA SUSP
1.0000 | Freq: Two times a day (BID) | NASAL | Status: DC
  Administered 2015-10-16 – 2015-10-23 (×6): 1 via NASAL
  Filled 2015-10-16: qty 16

## 2015-10-16 MED ORDER — FOLIC ACID 1 MG PO TABS
1.0000 mg | ORAL_TABLET | Freq: Every day | ORAL | Status: DC
  Administered 2015-10-19: 1 mg via ORAL
  Filled 2015-10-16 (×3): qty 1

## 2015-10-16 MED ORDER — ACETAMINOPHEN 650 MG RE SUPP
650.0000 mg | Freq: Four times a day (QID) | RECTAL | Status: DC | PRN
  Administered 2015-10-22: 650 mg via RECTAL
  Filled 2015-10-16: qty 1

## 2015-10-16 MED ORDER — STERILE WATER FOR INJECTION IJ SOLN
INTRAMUSCULAR | Status: AC
  Administered 2015-10-16: 2.1 mL
  Filled 2015-10-16: qty 10

## 2015-10-16 MED ORDER — VANCOMYCIN HCL IN DEXTROSE 1-5 GM/200ML-% IV SOLN: 1000.0000 mg | INTRAVENOUS | Status: DC

## 2015-10-16 NOTE — ED Triage Notes (Addendum)
Pt presents w/ increased lethargy and increased AMS since last night.  Denies pain.  Denies n/v/d.  Pt's family reports Pt fell this morning.  Pt reports that he fell while "trying to put my britches on."  Sts he "didn't sleep much last night."  Family reports Pt did not sleep all night.  Sts "he's been wild."  Hx of dementia.  Pt lives at home w/ his wife.    Family reports Pt was in skilled living([facility]) since mid-July and was discharged last Thursday (4 days ago).

## 2015-10-16 NOTE — ED Provider Notes (Signed)
[facility]-EMERGENCY DEPT Provider Note   CSN: [id number] Arrival date & time: 10/16/15  1018     History   Chief Complaint Chief Complaint  Patient presents with  . Altered Mental Status  . Fall    HPI Charles Scott is a 82 y.o. male.  82 yo M with a chief complaints of altered mental status. Started yesterday. Patient is been very confused and tired. They deny any symptoms leading up to that. Deny cough congestion denies abdominal pain denies vomiting or diarrhea. Patient recently got out of rehabilitation about a week ago and has been doing well at home until then. Denies any fevers or chills. Patient did fall this morning and has a skin tear on his elbow but they deny any other injury. Symptoms started prior to the fall.   The history is provided by the patient.  Altered Mental Status   This is a new problem. The current episode started yesterday. The problem has not changed since onset.Pertinent negatives include no confusion.  Fall  Pertinent negatives include no chest pain, no abdominal pain, no headaches and no shortness of breath.    Past Medical History:  Diagnosis Date  . Dementia   . Diabetes mellitus   . Gout   . Hypertension   . Vitamin B 12 deficiency     Patient Active Problem List   Diagnosis Date Noted  . Altered awareness, transient 10/16/2015  . Encephalopathy 10/16/2015  . TRIGGER FINGER 05/31/2008  . HAND PAIN 05/31/2008  . DERANGEMENT MENISCUS 06/18/2007  . JOINT EFFUSION, KNEE 06/18/2007  . KNEE PAIN 06/18/2007    Past Surgical History:  Procedure Laterality Date  . CATARACT EXTRACTION W/PHACO  11/18/2011   Procedure: CATARACT EXTRACTION PHACO AND INTRAOCULAR LENS PLACEMENT (IOC);  Surgeon: Susa Simmonds, MD;  Location: [facility];  Service: Ophthalmology;  Laterality: Left;  CDE:74.35  . CATARACT EXTRACTION W/PHACO Right 03/30/2012   Procedure: CATARACT EXTRACTION PHACO AND INTRAOCULAR LENS PLACEMENT (IOC);  Surgeon: Susa Simmonds, MD;   Location: [facility];  Service: Ophthalmology;  Laterality: Right;  CDE 74.16  . CHOLECYSTECTOMY  2000   [facility]       Home Medications    Prior to Admission medications   Medication Sig Start Date End Date Taking? Authorizing Provider  ALPRAZolam (XANAX) 0.25 MG tablet Take 0.25 mg by mouth every 8 (eight) hours as needed for anxiety.   Yes Historical Provider, MD  glipiZIDE (GLUCOTROL) 10 MG tablet Take 10 mg by mouth daily before breakfast.    Yes Historical Provider, MD  insulin aspart (NOVOLOG) 100 UNIT/ML injection Inject 0-15 Units into the skin 3 (three) times daily as needed for high blood sugar (per sliding scale).   Yes Historical Provider, MD  losartan (COZAAR) 25 MG tablet Take 25 mg by mouth every morning. 06/16/15  Yes Historical Provider, MD  metoprolol (LOPRESSOR) 50 MG tablet Take 50 mg by mouth 2 (two) times daily.   Yes Historical Provider, MD  OLANZapine (ZYPREXA) 5 MG tablet Take 5 mg by mouth at bedtime.   Yes Historical Provider, MD  pioglitazone (ACTOS) 15 MG tablet Take 15 mg by mouth daily. 06/26/15  Yes Historical Provider, MD  traZODone (DESYREL) 50 MG tablet Take 50 mg by mouth at bedtime.   Yes Historical Provider, MD  LANTUS SOLOSTAR 100 UNIT/ML Solostar Pen Inject 70 Units into the skin daily at 10 pm.  06/16/15   Historical Provider, MD    Family History History reviewed. No pertinent family  history.  Social History Social History  Substance Use Topics  . Smoking status: Current Some Day Smoker    Years: 60.00    Types: Cigars  . Smokeless tobacco: Never Used  . Alcohol use No     Allergies   Penicillins   Review of Systems Review of Systems  Constitutional: Positive for activity change. Negative for chills and fever.  HENT: Negative for congestion and facial swelling.   Eyes: Negative for discharge and visual disturbance.  Respiratory: Negative for shortness of breath.   Cardiovascular: Negative for chest pain and palpitations.  Gastrointestinal:  Negative for abdominal pain, diarrhea and vomiting.  Musculoskeletal: Negative for arthralgias and myalgias.  Skin: Negative for color change and rash.  Neurological: Negative for tremors, syncope and headaches.       Confusion  Psychiatric/Behavioral: Negative for confusion and dysphoric mood.     Physical Exam Updated Vital Signs BP 118/68 (BP Location: Right Arm)   Pulse 90   Temp 98.1 F (36.7 C) (Axillary)   Resp 20   Ht 5\' 7"  (1.702 m)   Wt 169 lb 15.6 oz (77.1 kg)   SpO2 100%   BMI 26.62 kg/m   Physical Exam  Constitutional: He is oriented to person, place, and time.  Chronically ill appearing, pale  HENT:  Head: Normocephalic and atraumatic.  Eyes: Conjunctivae and EOM are normal. Pupils are equal, round, and reactive to light.  Neck: Normal range of motion. No JVD present.  Cardiovascular: Normal rate and regular rhythm.   Pulmonary/Chest: Effort normal. No stridor. No respiratory distress.  Abdominal: He exhibits no distension. There is no tenderness. There is no guarding.  Musculoskeletal: Normal range of motion. He exhibits no edema.  Neurological: He is alert and oriented to person, place, and time. GCS eye subscore is 3. GCS verbal subscore is 4. GCS motor subscore is 5.  Skin: Skin is warm and dry.  Quarter sized skin tear to left elbow  Psychiatric: He has a normal mood and affect. His behavior is normal.     ED Treatments / Results  Labs (all labs ordered are listed, but only abnormal results are displayed) Labs Reviewed  COMPREHENSIVE METABOLIC PANEL - Abnormal; Notable for the following:       Result Value   CO2 21 (*)    Glucose, Bld 349 (*)    BUN 24 (*)    Creatinine, Ser 1.73 (*)    GFR calc non Af Amer 35 (*)    GFR calc Af Amer 41 (*)    All other components within normal limits  CBC WITH DIFFERENTIAL/PLATELET - Abnormal; Notable for the following:    RBC 4.12 (*)    All other components within normal limits  URINALYSIS, ROUTINE W  REFLEX MICROSCOPIC (NOT AT [facility]) - Abnormal; Notable for the following:    Glucose, UA 500 (*)    All other components within normal limits  BLOOD GAS, VENOUS - Abnormal; Notable for the following:    pCO2, Ven 40.9 (*)    All other components within normal limits  CBG MONITORING, ED - Abnormal; Notable for the following:    Glucose-Capillary 313 (*)    All other components within normal limits  MRSA PCR SCREENING  CULTURE, BLOOD (ROUTINE X 2)  CULTURE, BLOOD (ROUTINE X 2)  URINE CULTURE  AMMONIA  I-STAT CG4 LACTIC ACID, ED  I-STAT CG4 LACTIC ACID, ED    EKG  EKG Interpretation  Date/Time:  Monday October 16 2015 10:25:17 EDT  Ventricular Rate:  99 PR Interval:    QRS Duration: 89 QT Interval:  348 QTC Calculation: 447 R Axis:   55 Text Interpretation:  Sinus rhythm Abnormal R-wave progression, early transition Repol abnrm suggests ischemia, lateral leads No significant change since last tracing Confirmed by Jadaya Sommerfield MD, DANIEL [phone]) on 10/16/2015 10:44:07 AM       Radiology Dg Chest 2 View  Result Date: 10/16/2015 CLINICAL DATA:  82 year old male with a history of lethargy, code sepsis, not responding EXAM: CHEST  2 VIEW COMPARISON:  08/20/2015, 06/29/2015 FINDINGS: Cardiomediastinal silhouette unchanged. Calcifications of the aorta. Low lung volumes crowding the interstitium and the central vasculature. Lung apices not well evaluated given the overlying soft tissues of the neck. Linear opacities at the lung bases. No pleural effusion or pneumothorax. IMPRESSION: Low lung volumes with linear basilar opacities, potentially represent atelectasis and/or consolidation. Aortic atherosclerosis. Signed, Yvone Neu. Loreta Ave, DO [facility] Radiology Electronically Signed   By: Gilmer Mor D.O.   On: 10/16/2015 11:08   Ct Head Wo Contrast  Result Date: 10/16/2015 CLINICAL DATA:  82 year old male with a history of lethargy EXAM: CT HEAD  WITHOUT CONTRAST TECHNIQUE: Contiguous axial images were obtained from the base of the skull through the vertex without intravenous contrast. COMPARISON:  10/09/2009, CT 08/20/2015 FINDINGS: Unremarkable appearance of the calvarium without acute fracture or aggressive lesion. Unremarkable appearance of the scalp soft tissues. Unremarkable appearance of the orbits. Visualized aspects of the right maxillary sinus demonstrate persisting complete opacification with high density material. As was seen on the comparison CT of 08/20/2015, there is expansion of the medial maxillary wall. Left maxillary sinuses clear. No acute intracranial hemorrhage. No midline shift or mass effect. Gray-white differentiation is maintained. Mild volume loss. Unchanged configuration of the ventricles. Intracranial atherosclerosis. IMPRESSION: No CT evidence of acute intracranial abnormality. Complete opacification of the partially visualized maxillary sinus with high density material. Given today's appearance and the appearance on prior CT 08/20/2015, differential diagnosis should include mucocele with remodeling of the sinus, chronic obstruction with inspissated secretions, and fungal colonization. Less likely, the findings may be related to dentigerous cyst or other dental process. ENT evaluation may be considered. These results were called by telephone at the time of interpretation on 10/16/2015 at 11:25 am to Dr. Melene Plan , who verbally acknowledged these results. Signed, Yvone Neu. Loreta Ave, DO [facility] Radiology Electronically Signed   By: Gilmer Mor D.O.   On: 10/16/2015 11:28    Procedures Procedures (including critical care time)  Medications Ordered in ED Medications  traZODone (DESYREL) tablet 50 mg (not administered)  losartan (COZAAR) tablet 25 mg (not administered)  metoprolol (LOPRESSOR) tablet 50 mg (not administered)  enoxaparin (LOVENOX) injection 40 mg (not  administered)  sodium chloride flush (NS) 0.9 % injection 3 mL (not administered)  acetaminophen (TYLENOL) tablet 650 mg (not administered)    Or  acetaminophen (TYLENOL) suppository 650 mg (not administered)  ondansetron (ZOFRAN) tablet 4 mg (not administered)    Or  ondansetron (ZOFRAN) injection 4 mg (not administered)  thiamine (VITAMIN B-1) tablet 100 mg (not administered)  multivitamin with minerals tablet 1 tablet (not administered)  folic acid (FOLVITE) tablet 1 mg (not administered)  azithromycin (ZITHROMAX) tablet 500 mg (not administered)  fluticasone (FLONASE) 50 MCG/ACT nasal spray 1 spray (not administered)  sodium chloride (OCEAN) 0.65 % nasal spray 1 spray (not administered)  0.9 %  sodium chloride infusion ( Intravenous New Bag/Given 10/16/15 1511)  LORazepam (ATIVAN) tablet 1 mg (not administered)  insulin aspart (novoLOG) injection 0-9 Units (not administered)  OLANZapine (ZYPREXA) injection 5 mg (not administered)  vancomycin (VANCOCIN) 1,500 mg in sodium chloride 0.9 % 500 mL IVPB (1,500 mg Intravenous New Bag/Given 10/16/15 1109)  sodium chloride 0.9 % bolus 2,500 mL (2,500 mLs Intravenous New Bag/Given 10/16/15 1109)  meropenem (MERREM) 2 g in sodium chloride 0.9 % 100 mL IVPB (2 g Intravenous New Bag/Given 10/16/15 1234)     Initial Impression / Assessment and Plan / ED Course  I have reviewed the triage vital signs and the nursing notes.  Pertinent labs & imaging results that were available during my care of the patient were reviewed by me and considered in my medical decision making (see chart for details).  Clinical Course    82 yo M with AMS.  Concern for infection with soft BP, warm to touch, will start on abx, give IV fluids.  Afebrile.  CXR with possible pna, CT head with sinus disease.  Patient with no sinus TTP, no erythema, no noted intranasal findings.  Admit.   The patients results and plan were reviewed and discussed.   Any x-rays performed were  independently reviewed by myself.   Differential diagnosis were considered with the presenting HPI.  Medications  traZODone (DESYREL) tablet 50 mg (not administered)  losartan (COZAAR) tablet 25 mg (not administered)  metoprolol (LOPRESSOR) tablet 50 mg (not administered)  enoxaparin (LOVENOX) injection 40 mg (not administered)  sodium chloride flush (NS) 0.9 % injection 3 mL (not administered)  acetaminophen (TYLENOL) tablet 650 mg (not administered)    Or  acetaminophen (TYLENOL) suppository 650 mg (not administered)  ondansetron (ZOFRAN) tablet 4 mg (not administered)    Or  ondansetron (ZOFRAN) injection 4 mg (not administered)  thiamine (VITAMIN B-1) tablet 100 mg (not administered)  multivitamin with minerals tablet 1 tablet (not administered)  folic acid (FOLVITE) tablet 1 mg (not administered)  azithromycin (ZITHROMAX) tablet 500 mg (not administered)  fluticasone (FLONASE) 50 MCG/ACT nasal spray 1 spray (not administered)  sodium chloride (OCEAN) 0.65 % nasal spray 1 spray (not administered)  0.9 %  sodium chloride infusion ( Intravenous New Bag/Given 10/16/15 1511)  LORazepam (ATIVAN) tablet 1 mg (not administered)  insulin aspart (novoLOG) injection 0-9 Units (not administered)  OLANZapine (ZYPREXA) injection 5 mg (not administered)  vancomycin (VANCOCIN) 1,500 mg in sodium chloride 0.9 % 500 mL IVPB (1,500 mg Intravenous New Bag/Given 10/16/15 1109)  sodium chloride 0.9 % bolus 2,500 mL (2,500 mLs Intravenous New Bag/Given 10/16/15 1109)  meropenem (MERREM) 2 g in sodium chloride 0.9 % 100 mL IVPB (2 g Intravenous New Bag/Given 10/16/15 1234)    Vitals:   10/16/15 1215 10/16/15 1217 10/16/15 1227 10/16/15 1310  BP: 158/74 158/74  118/68  Pulse:  97  90  Resp: 18 21  20   Temp:    98.1 F (36.7 C)  TempSrc:    Axillary  SpO2:  94%  100%  Weight:   180 lb (81.6 kg) 169 lb 15.6 oz (77.1 kg)  Height:   5\' 7"  (1.702 m) 5\' 7"  (1.702 m)    Final diagnoses:  Transient  alteration of awareness    Admission/ observation were discussed with the admitting physician, patient and/or family and they are comfortable with the plan.    Final Clinical Impressions(s) / ED Diagnoses   Final diagnoses:  Transient alteration of awareness    New Prescriptions Current Discharge Medication List  Melene Plan, DO 10/16/15 1537

## 2015-10-16 NOTE — ED Notes (Signed)
Pt cannot use restroom at this time, aware urine specimen is needed.  

## 2015-10-16 NOTE — ED Notes (Signed)
Pt's wife is reporting that the Pt was having difficulty swallowing this morning.

## 2015-10-16 NOTE — Progress Notes (Signed)
Pharmacy Antibiotic Note  Charles CraftsJohn M Haddon is a 82 y.o. male admitted on 10/16/2015 with altered mental status, fall and  sepsis.  Pharmacy has been consulted for vancomycin and meropenem dosing.  Plan:  Vancomycin 1500mg  IV x 1 per EDP, then 1g IV q24h  Check trough at steady state, goal 15-20 mcg/ml  Meropenem 2g IV q24h for CrCl 30-50 ml/min  Follow up renal function & cultures, de-escalate as appropriate   Temp (24hrs), Avg:99 F (37.2 C), Min:99 F (37.2 C), Max:99 F (37.2 C)   Recent Labs Lab 10/16/15 1044 10/16/15 1049  WBC 8.0  --   CREATININE 1.73*  --   LATICACIDVEN  --  1.78    CrCl cannot be calculated (Unknown ideal weight.).    Allergies  Allergen Reactions  . Penicillins Swelling    Has patient had a PCN reaction causing immediate rash, facial/tongue/throat swelling, SOB or lightheadedness with hypotension: yes Has patient had a PCN reaction causing severe rash involving mucus membranes or skin necrosis: unknown Has patient had a PCN reaction that required hospitalization: yes unsure if admitted but was taken to ED Has patient had a PCN reaction occurring within the last 10 years: yes If all of the above answers are "NO", then may proceed with Cephalosporin use.     Antimicrobials this admission: 9/4 Vancomycin >> 9/4 Meropenem >>  Dose adjustments this admission: ---  Microbiology results: 9/4 BCx: sent 9/4 UCx: sent  Thank you for allowing pharmacy to be a part of this patient's care.  Loralee PacasErin Upton Russey, PharmD, BCPS Pager: [phone] 10/16/2015 12:17 PM

## 2015-10-16 NOTE — H&P (Signed)
History and Physical    Charles Scott ZOX:[id number] DOB: 12-Oct-1930 DOA: 10/16/2015  PCP: Fredirick Maudlin, MD   Patient coming from: Home  Chief Complaint: Altered mental status.   HPI: Charles Scott is a 82 y.o. male with medical history significant of dementia who presents to hospital with altered mental status. Patient was discharged from skilled nursing facility on August 31. He has been placed on antipsychotics to take at night to control his dementia and agitation. Last night around 8:30 PM he was noted to be very confused, disoriented, and agitated, he has been not able to sleep the whole night, his wife gave him double dose of his night antipsychotic agents including trazodone and olanzapine with no improvement of his symptoms. His confusion has been moderate to severe, no improving or worsening factors, associated with ambulatory dysfunction and does balance. It has been persistent since last night around 8:30 PM. Usually he ambulates without assistance, in terms of his dementia he tends to have "good days and bad days", but apparently has been progressive. All information obtained from patient's family at the bedside, patient unable to give any details due to his active confusion and disorientation.  ED Course: IV fluids and IV antibiotics. Patient restless and confused.   Review of Systems: Unable to obtain due to patient's confusion and disorientation  Past Medical History:  Diagnosis Date  . Dementia   . Diabetes mellitus   . Gout   . Hypertension   . Vitamin B 12 deficiency     Past Surgical History:  Procedure Laterality Date  . CATARACT EXTRACTION W/PHACO  11/18/2011   Procedure: CATARACT EXTRACTION PHACO AND INTRAOCULAR LENS PLACEMENT (IOC);  Surgeon: Susa Simmonds, MD;  Location: [facility];  Service: Ophthalmology;  Laterality: Left;  CDE:74.35  . CATARACT EXTRACTION W/PHACO Right 03/30/2012   Procedure: CATARACT EXTRACTION PHACO AND INTRAOCULAR LENS PLACEMENT (IOC);   Surgeon: Susa Simmonds, MD;  Location: [facility];  Service: Ophthalmology;  Laterality: Right;  CDE 74.16  . CHOLECYSTECTOMY  2000   [facility]     reports that he has been smoking Cigars.  He has smoked for the past 60.00 years. He has never used smokeless tobacco. He reports that he does not drink alcohol or use drugs.  Allergies  Allergen Reactions  . Penicillins Swelling    Has patient had a PCN reaction causing immediate rash, facial/tongue/throat swelling, SOB or lightheadedness with hypotension: yes Has patient had a PCN reaction causing severe rash involving mucus membranes or skin necrosis: unknown Has patient had a PCN reaction that required hospitalization: yes unsure if admitted but was taken to ED Has patient had a PCN reaction occurring within the last 10 years: yes If all of the above answers are "NO", then may proceed with Cephalosporin use.     History reviewed. No pertinent family history. Family history was reviewed and found to be noncontributory  Prior to Admission medications   Medication Sig Start Date End Date Taking? Authorizing Provider  ALPRAZolam (XANAX) 0.25 MG tablet Take 0.25 mg by mouth every 8 (eight) hours as needed for anxiety.   Yes Historical Provider, MD  glipiZIDE (GLUCOTROL) 10 MG tablet Take 10 mg by mouth daily before breakfast.    Yes Historical Provider, MD  insulin aspart (NOVOLOG) 100 UNIT/ML injection Inject 0-15 Units into the skin 3 (three) times daily as needed for high blood sugar (per sliding scale).   Yes Historical Provider, MD  losartan (COZAAR) 25 MG tablet Take 25  mg by mouth every morning. 06/16/15  Yes Historical Provider, MD  metoprolol (LOPRESSOR) 50 MG tablet Take 50 mg by mouth 2 (two) times daily.   Yes Historical Provider, MD  OLANZapine (ZYPREXA) 5 MG tablet Take 5 mg by mouth at bedtime.   Yes Historical Provider, MD  pioglitazone (ACTOS) 15 MG tablet Take 15 mg by mouth daily. 06/26/15  Yes Historical Provider, MD  traZODone  (DESYREL) 50 MG tablet Take 50 mg by mouth at bedtime.   Yes Historical Provider, MD  LANTUS SOLOSTAR 100 UNIT/ML Solostar Pen Inject 70 Units into the skin daily at 10 pm.  06/16/15   Historical Provider, MD    Physical Exam: Vitals:   10/16/15 1025 10/16/15 1217  BP: 100/58 158/74  Pulse: 102 97  Resp: 15 21  Temp: 99 F (37.2 C)   TempSrc: Oral   SpO2: 95% 94%      Constitutional: Looks deconditioned and an ill-looking appearing Vitals:   10/16/15 1025 10/16/15 1217  BP: 100/58 158/74  Pulse: 102 97  Resp: 15 21  Temp: 99 F (37.2 C)   TempSrc: Oral   SpO2: 95% 94%   Eyes: PERRL, lids normal, conjunctivae mild pale but no icterus. Nose and ears no deformities. Very faint left periorbital edema, there is no tenderness upon palpation of the paranasal sinuses, maxillary, ethmoidal and frontal. ENMT: Mucous membranes are dry. Posterior pharynx clear of any exudate or lesions.Normal dentition.  Neck: normal, supple, no masses, no thyromegaly Respiratory: clear to auscultation bilaterally, no wheezing, no crackles. Normal respiratory effort. No accessory muscle use.  Cardiovascular: Regular rate and rhythm, no murmurs / rubs / gallops. No extremity edema. 2+ pedal pulses. No carotid bruits.  Abdomen: no tenderness, no masses palpated. No hepatosplenomegaly. Bowel sounds positive.  Musculoskeletal: no clubbing / cyanosis. No joint deformity upper and lower extremities. Good ROM, no contractures. Normal muscle tone.  Skin: no rashes, lesions, ulcers. No induration healing wound on the left lateral mid leg.  Neurologic: Patient moves all 4 extremities spontaneously, strength seems to be preserved, he does follow commands. He said he is oriented times place and time. Noted to be incoherent.     Labs on Admission: I have personally reviewed following labs and imaging studies  CBC:  Recent Labs Lab 10/16/15 1044  WBC 8.0  NEUTROABS 6.0  HGB 13.5  HCT 40.1  MCV 97.3  PLT 171    Basic Metabolic Panel:  Recent Labs Lab 10/16/15 1044  NA 136  K 4.4  CL 108  CO2 21*  GLUCOSE 349*  BUN 24*  CREATININE 1.73*  CALCIUM 9.2   GFR: CrCl cannot be calculated (Unknown ideal weight.). Liver Function Tests:  Recent Labs Lab 10/16/15 1044  AST 19  ALT 23  ALKPHOS 79  BILITOT 0.6  PROT 6.8  ALBUMIN 3.8   No results for input(s): LIPASE, AMYLASE in the last 168 hours.  Recent Labs Lab 10/16/15 1108  AMMONIA 13   Coagulation Profile: No results for input(s): INR, PROTIME in the last 168 hours. Cardiac Enzymes: No results for input(s): CKTOTAL, CKMB, CKMBINDEX, TROPONINI in the last 168 hours. BNP (last 3 results) No results for input(s): PROBNP in the last 8760 hours. HbA1C: No results for input(s): HGBA1C in the last 72 hours. CBG:  Recent Labs Lab 10/16/15 1030  GLUCAP 313*   Lipid Profile: No results for input(s): CHOL, HDL, LDLCALC, TRIG, CHOLHDL, LDLDIRECT in the last 72 hours. Thyroid Function Tests: No results for input(s): TSH,  T4TOTAL, FREET4, T3FREE, THYROIDAB in the last 72 hours. Anemia Panel: No results for input(s): VITAMINB12, FOLATE, FERRITIN, TIBC, IRON, RETICCTPCT in the last 72 hours. Urine analysis:    Component Value Date/Time   COLORURINE YELLOW 10/16/2015 1037   APPEARANCEUR CLEAR 10/16/2015 1037   LABSPEC 1.016 10/16/2015 1037   PHURINE 5.0 10/16/2015 1037   GLUCOSEU 500 (A) 10/16/2015 1037   HGBUR NEGATIVE 10/16/2015 1037   BILIRUBINUR NEGATIVE 10/16/2015 1037   KETONESUR NEGATIVE 10/16/2015 1037   PROTEINUR NEGATIVE 10/16/2015 1037   NITRITE NEGATIVE 10/16/2015 1037   LEUKOCYTESUR NEGATIVE 10/16/2015 1037   Sepsis Labs: !!!!!!!!!!!!!!!!!!!!!!!!!!!!!!!!!!!!!!!!!!!! @LABRCNTIP (procalcitonin:4,lacticidven:4) )No results found for this or any previous visit (from the past 240 hour(s)).   Radiological Exams on Admission: Dg Chest 2 View  Result Date: 10/16/2015 CLINICAL DATA:  82 year old male with a  history of lethargy, code sepsis, not responding EXAM: CHEST  2 VIEW COMPARISON:  08/20/2015, 06/29/2015 FINDINGS: Cardiomediastinal silhouette unchanged. Calcifications of the aorta. Low lung volumes crowding the interstitium and the central vasculature. Lung apices not well evaluated given the overlying soft tissues of the neck. Linear opacities at the lung bases. No pleural effusion or pneumothorax. IMPRESSION: Low lung volumes with linear basilar opacities, potentially represent atelectasis and/or consolidation. Aortic atherosclerosis. Signed, Yvone Neu. Loreta Ave, DO [facility] Radiology Electronically Signed   By: Gilmer Mor D.O.   On: 10/16/2015 11:08   Ct Head Wo Contrast  Result Date: 10/16/2015 CLINICAL DATA:  82 year old male with a history of lethargy EXAM: CT HEAD WITHOUT CONTRAST TECHNIQUE: Contiguous axial images were obtained from the base of the skull through the vertex without intravenous contrast. COMPARISON:  10/09/2009, CT 08/20/2015 FINDINGS: Unremarkable appearance of the calvarium without acute fracture or aggressive lesion. Unremarkable appearance of the scalp soft tissues. Unremarkable appearance of the orbits. Visualized aspects of the right maxillary sinus demonstrate persisting complete opacification with high density material. As was seen on the comparison CT of 08/20/2015, there is expansion of the medial maxillary wall. Left maxillary sinuses clear. No acute intracranial hemorrhage. No midline shift or mass effect. Gray-white differentiation is maintained. Mild volume loss. Unchanged configuration of the ventricles. Intracranial atherosclerosis. IMPRESSION: No CT evidence of acute intracranial abnormality. Complete opacification of the partially visualized maxillary sinus with high density material. Given today's appearance and the appearance on prior CT 08/20/2015, differential diagnosis should include mucocele with remodeling of the  sinus, chronic obstruction with inspissated secretions, and fungal colonization. Less likely, the findings may be related to dentigerous cyst or other dental process. ENT evaluation may be considered. These results were called by telephone at the time of interpretation on 10/16/2015 at 11:25 am to Dr. Melene Plan , who verbally acknowledged these results. Signed, Yvone Neu. Loreta Ave, DO [facility] Radiology Electronically Signed   By: Gilmer Mor D.O.   On: 10/16/2015 11:28    EKG: Independently reviewed. Normal sinus rhythm, rate of 99 bpm, normal axis, normal intervals. corrected QT 447.  Chest x-ray. Personally reviewed [facility] film which is hypoinflated, good penetration, noted a linear opacity on the right base suggestive for atelectasis.  Assessment/Plan Active Problems:   Altered awareness, transient   Encephalopathy   This is a 82 year old gentleman who has moderate to severe dementia, he has been recently discharged from a skilled nursing facility after being treated for MRSA skin infection. Patient has been placed on antipsychotics at night to prevent agitation. For last 16 hours the patient has been confused, disoriented,  incoherent. Last night he has not been able to sleep, he did not respond to his usual antipsychotic regimen, double dose. On initial physical examination he is disoriented, confused, ill-looking appearing and deconditioned. His blood pressure is 158/74, heart rate 97, respiratory rate 21, oxygen saturation 95% on RA. He does have a very faint left periorbital edema, his lungs are clear to auscultation, heart S1-S2 present rhythmic, his abdomen soft, skin with no significant rashes. His sodium is 136, potassium 4.4, BUN 24, creatinine 1.73, glucose 349, white count 8.0, hemoglobin 13.5, hematocrit 40.1, platelet count 171. His venous blood gas of pH 7.37, lactic acid 1.78, urine analysis with 500 mg/dl glucose, negative leukocytes and  nitrates. Chest x-ray has atelectasis in the right base, his CT head personally reviewed noting significant atrophy, reported as no acute intracranial abnormalities, incidental finding of a complete opacification maxillary sinuses on the right. His EKG has normal intervals.  Working diagnosis. Metabolic encephalopathy with features of delirium, right maxillary chronic sinusitis.  1. Metabolic encephalopathy. Patient has disorganized thinking, lack of attention suggesting delirium related to his dementia. Will place patient on neuro checks every 4 hours, supportive IV fluids. Aspiration precautions and fall precautions. Will change alprazolam to lorazepam, will add Haldol 1 mg IM as needed every 4 hours for agitation. Will continue olanzapine 5 mg at bedtime, but will change to intramuscular dosing, continue trazodone 50 mg at night. Will add vitamins with folic acid, multivitamins and thiamine. Will consult neurology for further assistance.  2. Right maxillary sinusitis. Suspected to be chronic but an infection can trigger delirium in a patient with baseline dementia. Will add Flonase and saline nasal spray, azithromycin 500 mg daily.  3. Diabetes mellitus type 2. Uncontrolled diabetes, will start patient on insulin sliding-scale and will calculate his insulin requirements before starting on a basal regimen of long-acting insulin. Patient has poor oral intake due to his encephalopathy. Will hold on oral hypoglycemic agents,  target serum glucose in the hospital 140-180.  4. Hypertension. Will continue metoprolol tartrate 50 g twice daily, gentle hydration with isotonic saline.  5. Chronic kidney disease. Old records personally reviewed base creatinine around 1.7 with a calculated GFR of 37 consistent with chronic kidney disease stage III. Potassium is 4.4 and serum sodium 136, continue hydration with isotonic saline. Avoid hypotension or nephrotoxic agents.  6. Pulmonary. Chest x-ray showing  atelectasis at right base, at this point no other clinical signs of pneumonia. Will continue oximetry monitoring and submental oxygen as needed, aspiration precautions.  Patient continued on high risk of developing worsening encephalopathy and delirium.    DVT prophylaxis: lovenox Code Status: full Family Communication: I spoke with patient's family at the bedside and all questions were addressed, key information for patient's care was obtained.   Disposition Plan: May need memory unit or assisted living.  Consults called: Neurology, who will see him in am, spoke with Dr  William Hamburger.  Admission status: Inpatient    Meela Wareing Annett Gula MD [facility] Pager [phone]  If 7PM-7AM, please contact night-coverage [email or web address] Password [email or web address]  10/16/2015, 12:26 PM

## 2015-10-17 ENCOUNTER — Inpatient Hospital Stay (HOSPITAL_COMMUNITY): Payer: Medicare Other

## 2015-10-17 ENCOUNTER — Other Ambulatory Visit (HOSPITAL_COMMUNITY): Payer: Medicare Other

## 2015-10-17 LAB — BLOOD CULTURE ID PANEL (REFLEXED)
ACINETOBACTER BAUMANNII: NOT DETECTED
CANDIDA ALBICANS: NOT DETECTED
CANDIDA KRUSEI: NOT DETECTED
CANDIDA PARAPSILOSIS: NOT DETECTED
Candida glabrata: NOT DETECTED
Candida tropicalis: NOT DETECTED
ENTEROBACTERIACEAE SPECIES: NOT DETECTED
ENTEROCOCCUS SPECIES: NOT DETECTED
ESCHERICHIA COLI: NOT DETECTED
Enterobacter cloacae complex: NOT DETECTED
Haemophilus influenzae: NOT DETECTED
KLEBSIELLA OXYTOCA: NOT DETECTED
Klebsiella pneumoniae: NOT DETECTED
LISTERIA MONOCYTOGENES: NOT DETECTED
Neisseria meningitidis: NOT DETECTED
Proteus species: NOT DETECTED
Pseudomonas aeruginosa: NOT DETECTED
STREPTOCOCCUS PYOGENES: NOT DETECTED
Serratia marcescens: NOT DETECTED
Staphylococcus aureus (BCID): NOT DETECTED
Staphylococcus species: NOT DETECTED
Streptococcus agalactiae: NOT DETECTED
Streptococcus pneumoniae: NOT DETECTED
Streptococcus species: DETECTED — AB

## 2015-10-17 LAB — URINE CULTURE: Culture: NO GROWTH

## 2015-10-17 LAB — COMPREHENSIVE METABOLIC PANEL
ALK PHOS: 78 U/L (ref 38–126)
ALT: 21 U/L (ref 17–63)
AST: 21 U/L (ref 15–41)
Albumin: 3.6 g/dL (ref 3.5–5.0)
Anion gap: 5 (ref 5–15)
BUN: 13 mg/dL (ref 6–20)
CALCIUM: 9.1 mg/dL (ref 8.9–10.3)
CHLORIDE: 113 mmol/L — AB (ref 101–111)
CO2: 25 mmol/L (ref 22–32)
CREATININE: 1.3 mg/dL — AB (ref 0.61–1.24)
GFR, EST AFRICAN AMERICAN: 57 mL/min — AB (ref >60)
GFR, EST NON AFRICAN AMERICAN: 49 mL/min — AB (ref >60)
GLUCOSE: 131 mg/dL — AB (ref 65–99)
Potassium: 3.4 mmol/L — ABNORMAL LOW (ref 3.5–5.1)
SODIUM: 143 mmol/L (ref 135–145)
Total Bilirubin: 1.2 mg/dL (ref 0.3–1.2)
Total Protein: 6.8 g/dL (ref 6.5–8.1)

## 2015-10-17 LAB — GLUCOSE, CAPILLARY
GLUCOSE-CAPILLARY: 131 mg/dL — AB (ref 65–99)
GLUCOSE-CAPILLARY: 148 mg/dL — AB (ref 65–99)
GLUCOSE-CAPILLARY: 159 mg/dL — AB (ref 65–99)
Glucose-Capillary: 130 mg/dL — ABNORMAL HIGH (ref 65–99)
Glucose-Capillary: 132 mg/dL — ABNORMAL HIGH (ref 65–99)
Glucose-Capillary: 179 mg/dL — ABNORMAL HIGH (ref 65–99)

## 2015-10-17 LAB — CBC
HCT: 40.8 % (ref 39.0–52.0)
Hemoglobin: 13.9 g/dL (ref 13.0–17.0)
MCH: 33.1 pg (ref 26.0–34.0)
MCHC: 34.1 g/dL (ref 30.0–36.0)
MCV: 97.1 fL (ref 78.0–100.0)
PLATELETS: 168 10*3/uL (ref 150–400)
RBC: 4.2 MIL/uL — ABNORMAL LOW (ref 4.22–5.81)
RDW: 14.6 % (ref 11.5–15.5)
WBC: 7.2 10*3/uL (ref 4.0–10.5)

## 2015-10-17 MED ORDER — ZIPRASIDONE MESYLATE 20 MG IM SOLR: 10.0000 mg | Freq: Once | INTRAMUSCULAR | Status: DC

## 2015-10-17 MED ORDER — HALOPERIDOL LACTATE 5 MG/ML IJ SOLN
1.0000 mg | INTRAMUSCULAR | Status: DC | PRN
  Filled 2015-10-17: qty 1

## 2015-10-17 MED ORDER — HALOPERIDOL 1 MG PO TABS
1.0000 mg | ORAL_TABLET | ORAL | Status: DC | PRN
  Administered 2015-10-17: 1 mg via ORAL
  Filled 2015-10-17 (×3): qty 1

## 2015-10-17 MED ORDER — SODIUM CHLORIDE 0.45 % IV SOLN
INTRAVENOUS | Status: DC
  Administered 2015-10-17 – 2015-10-19 (×5): via INTRAVENOUS

## 2015-10-17 MED ORDER — HALOPERIDOL LACTATE 5 MG/ML IJ SOLN
1.0000 mg | INTRAMUSCULAR | Status: DC | PRN
  Administered 2015-10-18 – 2015-10-19 (×7): 1 mg via INTRAMUSCULAR
  Filled 2015-10-17 (×7): qty 1

## 2015-10-17 MED ORDER — HALOPERIDOL 2 MG PO TABS
2.0000 mg | ORAL_TABLET | ORAL | Status: DC | PRN
  Administered 2015-10-24: 2 mg via ORAL
  Filled 2015-10-17 (×4): qty 1

## 2015-10-17 MED ORDER — HYDRALAZINE HCL 20 MG/ML IJ SOLN: 10.0000 mg | INTRAMUSCULAR | Status: DC | PRN

## 2015-10-17 MED ORDER — OLANZAPINE 10 MG IM SOLR
5.0000 mg | Freq: Once | INTRAMUSCULAR | Status: AC
  Administered 2015-10-17: 5 mg via INTRAMUSCULAR
  Filled 2015-10-17: qty 10

## 2015-10-17 NOTE — Progress Notes (Signed)
Patient very agitated and constantly trying to get out of bed.  Order for safety sitter placed.  Ativan given.  Patient agitation did not decrease after receiving ativan.  Family called and made aware.  Daughter, Judeth CornfieldStephanie came to sit with patient during her lunch break.

## 2015-10-17 NOTE — Consult Note (Signed)
NEURO HOSPITALIST CONSULT NOTE   Requestig physician: Dr. Ella Jubilee   Reason for Consult:Patient with known dementia and agitation during evenings now showing increased agitation and confusion after given double the dose of antipsychotics for nighttime.   History obtained from:   Chart     HPI:                                                                                                                                          Charles Scott is an 82 y.o. male  Per chart ( attempted spouse But she was unavailable)"with medical history significant of dementia who presents to hospital with altered mental status. Patient was discharged from skilled nursing facility on August 31. He has been placed on antipsychotics to take at night to control his dementia and agitation. Two nights ago around 8:30 PM he was noted to be very confused, disoriented, and agitated, he has been not able to sleep the whole night, his wife gave him double dose of his night antipsychotic agents including trazodone and olanzapine with no improvement of his symptoms. His confusion has been moderate to severe, no improving or worsening factors, associated with ambulatory dysfunction and does balance. Usually he ambulates without assistance, in terms of his dementia he tends to have "good days and bad days", but apparently has been progressive.  Currently he is in his room he is alert to self and able to follow commands but does not know the name of the facility or why he was brought here. He converses well and does not show any agitation. Patient has known dementia.  Past Medical History:  Diagnosis Date  . Dementia   . Diabetes mellitus   . Gout   . Hypertension   . Vitamin B 12 deficiency     Past Surgical History:  Procedure Laterality Date  . CATARACT EXTRACTION W/PHACO  11/18/2011   Procedure: CATARACT EXTRACTION PHACO AND INTRAOCULAR LENS PLACEMENT (IOC);  Surgeon: Susa Simmonds, MD;  Location:  [facility];  Service: Ophthalmology;  Laterality: Left;  CDE:74.35  . CATARACT EXTRACTION W/PHACO Right 03/30/2012   Procedure: CATARACT EXTRACTION PHACO AND INTRAOCULAR LENS PLACEMENT (IOC);  Surgeon: Susa Simmonds, MD;  Location: [facility];  Service: Ophthalmology;  Laterality: Right;  CDE 74.16  . CHOLECYSTECTOMY  2000   [facility]    History reviewed. No pertinent family history.  Family History:Unable to obtain from patient   Social History:  reports that he has been smoking Cigars.  He has smoked for the past 60.00 years. He has never used smokeless tobacco. He reports that he does not drink alcohol or use drugs.  Allergies  Allergen Reactions  . Penicillins Swelling    Has patient had a PCN reaction causing immediate rash, facial/tongue/throat swelling, SOB  or lightheadedness with hypotension: yes Has patient had a PCN reaction causing severe rash involving mucus membranes or skin necrosis: unknown Has patient had a PCN reaction that required hospitalization: yes unsure if admitted but was taken to ED Has patient had a PCN reaction occurring within the last 10 years: yes If all of the above answers are "NO", then may proceed with Cephalosporin use.     MEDICATIONS:                                                                                                                     Prior to Admission:  Prescriptions Prior to Admission  Medication Sig Dispense Refill Last Dose  . ALPRAZolam (XANAX) 0.25 MG tablet Take 0.25 mg by mouth every 8 (eight) hours as needed for anxiety.   10/16/2015 at Unknown time  . glipiZIDE (GLUCOTROL) 10 MG tablet Take 10 mg by mouth daily before breakfast.    10/16/2015 at Unknown time  . insulin aspart (NOVOLOG) 100 UNIT/ML injection Inject 0-15 Units into the skin 3 (three) times daily as needed for high blood sugar (per sliding scale).   10/15/2015 at Unknown time  . losartan (COZAAR) 25 MG tablet Take 25 mg by mouth every morning.   10/16/2015 at Unknown time  .  metoprolol (LOPRESSOR) 50 MG tablet Take 50 mg by mouth 2 (two) times daily.   10/16/2015 at 0500  . OLANZapine (ZYPREXA) 5 MG tablet Take 5 mg by mouth at bedtime.   10/15/2015 at Unknown time  . pioglitazone (ACTOS) 15 MG tablet Take 15 mg by mouth daily.   10/16/2015 at Unknown time  . traZODone (DESYREL) 50 MG tablet Take 50 mg by mouth at bedtime.   10/15/2015 at Unknown time  . LANTUS SOLOSTAR 100 UNIT/ML Solostar Pen Inject 70 Units into the skin daily at 10 pm.    Not Taking at Unknown time   Scheduled: . azithromycin  500 mg Oral Q24H  . enoxaparin (LOVENOX) injection  40 mg Subcutaneous Q24H  . fluticasone  1 spray Each Nare BID  . folic acid  1 mg Oral Daily  . insulin aspart  0-9 Units Subcutaneous Q4H  . losartan  25 mg Oral q morning - 10a  . metoprolol  50 mg Oral BID  . multivitamin with minerals  1 tablet Oral Daily  . OLANZapine  5 mg Intramuscular QHS  . sodium chloride flush  3 mL Intravenous Q12H  . thiamine  100 mg Oral Daily  . traZODone  50 mg Oral QHS     ROS:  History obtained from the patient  General ROS: negative for - chills, fatigue, fever, night sweats, weight gain or weight loss Psychological ROS: negative for - behavioral disorder, hallucinations, memory difficulties, mood swings or suicidal ideation Ophthalmic ROS: negative for - blurry vision, double vision, eye pain or loss of vision ENT ROS: negative for - epistaxis, nasal discharge, oral lesions, sore throat, tinnitus or vertigo Allergy and Immunology ROS: negative for - hives or itchy/watery eyes Hematological and Lymphatic ROS: negative for - bleeding problems, bruising or swollen lymph nodes Endocrine ROS: negative for - galactorrhea, hair pattern changes, polydipsia/polyuria or temperature intolerance Respiratory ROS: negative for - cough, hemoptysis, shortness of  breath or wheezing Cardiovascular ROS: negative for - chest pain, dyspnea on exertion, edema or irregular heartbeat Gastrointestinal ROS: negative for - abdominal pain, diarrhea, hematemesis, nausea/vomiting or stool incontinence Genito-Urinary ROS: negative for - dysuria, hematuria, incontinence or urinary frequency/urgency Musculoskeletal ROS: negative for - joint swelling or muscular weakness Neurological ROS: as noted in HPI Dermatological ROS: negative for rash and skin lesion changes   Blood pressure (!) 130/103, pulse 80, temperature 98.2 F (36.8 C), temperature source Oral, resp. rate 17, height 5\' 7"  (1.702 m), weight 77.1 kg (169 lb 15.6 oz), SpO2 98 %.   Neurologic Examination:                                                                                                      HEENT-  Normocephalic, no lesions, without obvious abnormality.  Normal external eye and conjunctiva.  Normal TM's bilaterally.  Normal auditory canals and external ears. Normal external nose, mucus membranes and septum.  Normal pharynx. Cardiovascular- S1, S2 normal, pulses palpable throughout   Lungs- chest clear, no wheezing, rales, normal symmetric air entry Abdomen- normal findings: bowel sounds normal Extremities- no edema Lymph-no adenopathy palpable Musculoskeletal-no joint tenderness, deformity or swelling Skin-warm and dry, no hyperpigmentation, vitiligo, or suspicious lesions  Neurological Examination Mental Status: Alert, oriented to self but not place month or date.  Speech fluent without evidence of aphasia.  Able to follow 3 step commands without difficulty including showing me his left thumb, taking his left thumb and touch in his right ear and then pointing to the ceiling. Able to name objects such as pen and watch. Cranial Nerves: II: Visual fields grossly normal, pupils equal, round, reactive to light and accommodation III,IV, VI: ptosis bilaterally present and tells me that he has had  difficulty opening his eyelids fully for a long time, extra-ocular motions intact bilaterally V,VII: smile symmetric, facial light touch sensation normal bilaterally VIII: hearing normal bilaterally IX,X: uvula rises symmetrically XI: bilateral shoulder shrug XII: midline tongue extension Motor: Right : Upper extremity   5/5    Left:     Upper extremity   5/5  Lower extremity   5/5     Lower extremity   5/5 Tone and bulk:normal tone throughout; no atrophy noted Sensory: Pinprick and light touch intact throughout, bilaterally however he does show bilateral lower extremity peripheral neuropathy Deep Tendon Reflexes: 1+ and symmetric throughout upper extremities with no  knee jerk or ankle jerk Plantars: Right: downgoing   Left: downgoing Cerebellar: No dysmetria noted with finger to nose or heel to shin Gait: Not tested      Lab Results: Basic Metabolic Panel:  Recent Labs Lab 10/16/15 1044 10/17/15 0452  NA 136 143  K 4.4 3.4*  CL 108 113*  CO2 21* 25  GLUCOSE 349* 131*  BUN 24* 13  CREATININE 1.73* 1.30*  CALCIUM 9.2 9.1    Liver Function Tests:  Recent Labs Lab 10/16/15 1044 10/17/15 0452  AST 19 21  ALT 23 21  ALKPHOS 79 78  BILITOT 0.6 1.2  PROT 6.8 6.8  ALBUMIN 3.8 3.6   No results for input(s): LIPASE, AMYLASE in the last 168 hours.  Recent Labs Lab 10/16/15 1108  AMMONIA 13    CBC:  Recent Labs Lab 10/16/15 1044 10/17/15 0452  WBC 8.0 7.2  NEUTROABS 6.0  --   HGB 13.5 13.9  HCT 40.1 40.8  MCV 97.3 97.1  PLT 171 168    Cardiac Enzymes: No results for input(s): CKTOTAL, CKMB, CKMBINDEX, TROPONINI in the last 168 hours.  Lipid Panel: No results for input(s): CHOL, TRIG, HDL, CHOLHDL, VLDL, LDLCALC in the last 168 hours.  CBG:  Recent Labs Lab 10/16/15 1621 10/16/15 1952 10/17/15 0028 10/17/15 0355 10/17/15 0824  GLUCAP 81 207* 132* 130* 179*    Microbiology: Results for orders placed or performed during the hospital  encounter of 10/16/15  Blood Culture (routine x 2)     Status: None (Preliminary result)   Collection Time: 10/16/15 10:40 AM  Result Value Ref Range Status   Specimen Description BLOOD BLOOD LEFT FOREARM  Final   Special Requests BOTTLES DRAWN AEROBIC AND ANAEROBIC 5CC  Final   Culture  Setup Time   Final    GRAM POSITIVE COCCI IN CHAINS ANAEROBIC BOTTLE ONLY Organism ID to follow Performed at [facility]    Culture PENDING  Incomplete   Report Status PENDING  Incomplete  MRSA PCR Screening     Status: None   Collection Time: 10/16/15  1:50 PM  Result Value Ref Range Status   MRSA by PCR NEGATIVE NEGATIVE Final    Comment:        The GeneXpert MRSA Assay (FDA approved for NASAL specimens only), is one component of a comprehensive MRSA colonization surveillance program. It is not intended to diagnose MRSA infection nor to guide or monitor treatment for MRSA infections.     Coagulation Studies: No results for input(s): LABPROT, INR in the last 72 hours.  Imaging: Dg Chest 2 View  Result Date: 10/16/2015 CLINICAL DATA:  82 year old male with a history of lethargy, code sepsis, not responding EXAM: CHEST  2 VIEW COMPARISON:  08/20/2015, 06/29/2015 FINDINGS: Cardiomediastinal silhouette unchanged. Calcifications of the aorta. Low lung volumes crowding the interstitium and the central vasculature. Lung apices not well evaluated given the overlying soft tissues of the neck. Linear opacities at the lung bases. No pleural effusion or pneumothorax. IMPRESSION: Low lung volumes with linear basilar opacities, potentially represent atelectasis and/or consolidation. Aortic atherosclerosis. Signed, Yvone Neu. Loreta Ave, DO [facility] Radiology Electronically Signed   By: Gilmer Mor D.O.   On: 10/16/2015 11:08   Ct Head Wo Contrast  Result Date: 10/16/2015 CLINICAL DATA:  82 year old male with a history of lethargy EXAM: CT HEAD WITHOUT  CONTRAST TECHNIQUE: Contiguous axial images were obtained from the base of the skull through the vertex without intravenous contrast.  COMPARISON:  10/09/2009, CT 08/20/2015 FINDINGS: Unremarkable appearance of the calvarium without acute fracture or aggressive lesion. Unremarkable appearance of the scalp soft tissues. Unremarkable appearance of the orbits. Visualized aspects of the right maxillary sinus demonstrate persisting complete opacification with high density material. As was seen on the comparison CT of 08/20/2015, there is expansion of the medial maxillary wall. Left maxillary sinuses clear. No acute intracranial hemorrhage. No midline shift or mass effect. Gray-white differentiation is maintained. Mild volume loss. Unchanged configuration of the ventricles. Intracranial atherosclerosis. IMPRESSION: No CT evidence of acute intracranial abnormality. Complete opacification of the partially visualized maxillary sinus with high density material. Given today's appearance and the appearance on prior CT 08/20/2015, differential diagnosis should include mucocele with remodeling of the sinus, chronic obstruction with inspissated secretions, and fungal colonization. Less likely, the findings may be related to dentigerous cyst or other dental process. ENT evaluation may be considered. These results were called by telephone at the time of interpretation on 10/16/2015 at 11:25 am to Dr. Melene Plan , who verbally acknowledged these results. Signed, Yvone Neu. Loreta Ave, DO [facility] Radiology Electronically Signed   By: Gilmer Mor D.O.   On: 10/16/2015 11:28       Assessment and plan per attending neurologist  Felicie Morn PA-C [facility] [phone]  10/17/2015, 8:53 AM   Assessment/Plan:  This is an 82 year old male with a known dementia and agitation at night time. Due to evening agitation patient has been prescribed trazodone and Zyprexa daily  at bedtime. Patient was brought to [facility] emergency department secondary to increased agitation in the setting of receiving double the dose of trazodone and Zyprexa by his wife due to the agitation. CT of head showed no acute abnormalities. Currently he is resting comfortably, he does show confusion with knowledge of his location however is able to follow all commands. I've attempted to call the wife to get a good detailed history about his baseline but known is available. Patient's ammonia was within normal limits, creatinine was not elevated compared to his baseline, AST and ALT were within normal limits. He did have an elevated glucose on arrival which has now been adjusted. 1 of 2 blood cultures did show gram-positive cocci in chains twitchy is being treated for at this current time.  Suspect altered mental status in the setting of infection causing toxic metabolic encephalopathy.  At this time no further neurological intervention is recommended. If patient worsens after infection has been treated in time has been given for him to return to baseline would consider EEG.  At this time neurology will sign off.

## 2015-10-17 NOTE — Progress Notes (Signed)
PHARMACY - PHYSICIAN COMMUNICATION CRITICAL VALUE ALERT - BLOOD CULTURE IDENTIFICATION (BCID)  Results for orders placed or performed during the hospital encounter of 10/16/15  Blood Culture ID Panel (Reflexed) (Collected: 10/16/2015 10:40 AM)  Result Value Ref Range   Enterococcus species NOT DETECTED NOT DETECTED   Listeria monocytogenes NOT DETECTED NOT DETECTED   Staphylococcus species NOT DETECTED NOT DETECTED   Staphylococcus aureus NOT DETECTED NOT DETECTED   Streptococcus species DETECTED (A) NOT DETECTED   Streptococcus agalactiae NOT DETECTED NOT DETECTED   Streptococcus pneumoniae NOT DETECTED NOT DETECTED   Streptococcus pyogenes NOT DETECTED NOT DETECTED   Acinetobacter baumannii NOT DETECTED NOT DETECTED   Enterobacteriaceae species NOT DETECTED NOT DETECTED   Enterobacter cloacae complex NOT DETECTED NOT DETECTED   Escherichia coli NOT DETECTED NOT DETECTED   Klebsiella oxytoca NOT DETECTED NOT DETECTED   Klebsiella pneumoniae NOT DETECTED NOT DETECTED   Proteus species NOT DETECTED NOT DETECTED   Serratia marcescens NOT DETECTED NOT DETECTED   Haemophilus influenzae NOT DETECTED NOT DETECTED   Neisseria meningitidis NOT DETECTED NOT DETECTED   Pseudomonas aeruginosa NOT DETECTED NOT DETECTED   Candida albicans NOT DETECTED NOT DETECTED   Candida glabrata NOT DETECTED NOT DETECTED   Candida krusei NOT DETECTED NOT DETECTED   Candida parapsilosis NOT DETECTED NOT DETECTED   Candida tropicalis NOT DETECTED NOT DETECTED    Name of physician (or Provider) Contacted: Arrien  Changes to prescribed antibiotics required: No changes.  Clance BollRunyon, Sonal Dorwart 10/17/2015  10:20 AM

## 2015-10-17 NOTE — Progress Notes (Signed)
PROGRESS NOTE    Charles CraftsJohn M Scott  ZOX:[id number] DOB: 03/09/1930 DOA: 10/16/2015 PCP: Fredirick MaudlinHAWKINS,EDWARD L, MD   Brief Narrative:  Metabolic encephalopathy with delirium. 82 yo male with moderate dementia, on antipsychotics at home. Presented with acute decompensation. Found maxillary sinusitis. Follow neurology recommendations. Patient's family requesting patient to be placed in am memory unit for safety.   Assessment & Plan:   Active Problems:   Altered awareness, transient   Encephalopathy   1. Metabolic encephalopathy. Patient still disorientated, but improved attention and organized thinking. Will continue regimen with pm olanzapine and trazodone, as needed lorazepam and will add haldol.  Follow on neurology recommendations. Continue supportive care with IV fluids and glycemic control. Continue thiamine, folic acid and multivitamins. Physical therapy evaluation.   2. Maxillary sinusitis. Will continue azithromycin, fluticasone and saline nasal spry.  3. HTN. Blood pressure with systolic 120 to 130, will continue on metoprolol for now, continue losartan.  Will add as needed hydralazine IV for blood pressure greater than 190 systolic.  4. T2DM. Will continue insulin sliding scale for glucose cover and monitoring, continue to hold on oral hypoglycemic agents, serum, glucose 132-130-179. Will hold for now on basal insulin. Target serum glucose in the hospital 140 to 180 mg/dl.  5. CKD. Calculated GFR 37 c/w stage III. Noted Na at 143, Cl 113, will change fluids to hypotonic saline at 75 cc/hr to prevent worsening hypernatremia and hyperchloremia. Will follow renal panel in am.   Patient continue at moderate risk for worsening encephalopathy.   DVT prophylaxis: lovenox Code Status: full Family Communication: No family at bedside.   Disposition Plan: May need memory unit or assisted living.  Consults called: Neurology, who will see him in am, spoke with Dr  William HamburgerShickhman.  Admission status:  Inpatient     Consultants:   Neurology  Procedures:    Antimicrobials:   Azithromycin #1    Subjective: Continue confusion, no chest pain or dyspnea. Denies any nausea or vomiting, no abdominal pain. No cough.   Objective: Vitals:   10/16/15 1310 10/16/15 1800 10/16/15 2123 10/17/15 0704  BP: 118/68 (!) 157/77 123/89 (!) 130/103  Pulse: 90 84 82 80  Resp: 20  18 17   Temp: 98.1 F (36.7 C)  98.6 F (37 C) 98.2 F (36.8 C)  TempSrc: Axillary  Oral Oral  SpO2: 100%  100% 98%  Weight: 77.1 kg (169 lb 15.6 oz)     Height: 5\' 7"  (1.702 m)       Intake/Output Summary (Last 24 hours) at 10/17/15 0801 Last data filed at 10/17/15 0421  Gross per 24 hour  Intake           781.67 ml  Output             1925 ml  Net         -1143.33 ml   Filed Weights   10/16/15 1227 10/16/15 1310  Weight: 81.6 kg (180 lb) 77.1 kg (169 lb 15.6 oz)    Examination:  General exam: deconditioned. Not in pain or dyspnea E ENT: mild conjunctival pallor, no icterus, oral mucosa moist. Respiratory system: Respiratory effort normal. Mild decreased breath sounds at bases, no wheezing, rales or rhonchi.  Cardiovascular system: S1 & S2 heard, RRR. No JVD, murmurs, rubs, gallops or clicks. No pedal edema. Gastrointestinal system: Abdomen is nondistended, soft and nontender. No organomegaly or masses felt. Normal bowel sounds heard. Central nervous system: Patient awake and alert, positive disorientation in place and situation, but not  person or time, able to count backwards from 5 to 1. No signs of disorganized thinking.  Extremities: Symmetric 5 x 5 power. Skin: No rashes, lesions or ulcers     Data Reviewed: I have personally reviewed following labs and imaging studies  CBC:  Recent Labs Lab 10/16/15 1044 10/17/15 0452  WBC 8.0 7.2  NEUTROABS 6.0  --   HGB 13.5 13.9  HCT 40.1 40.8  MCV 97.3 97.1  PLT 171 168   Basic Metabolic Panel:  Recent Labs Lab 10/16/15 1044 10/17/15 0452    NA 136 143  K 4.4 3.4*  CL 108 113*  CO2 21* 25  GLUCOSE 349* 131*  BUN 24* 13  CREATININE 1.73* 1.30*  CALCIUM 9.2 9.1   GFR: Estimated Creatinine Clearance: 41 mL/min (by C-G formula based on SCr of 1.3 mg/dL). Liver Function Tests:  Recent Labs Lab 10/16/15 1044 10/17/15 0452  AST 19 21  ALT 23 21  ALKPHOS 79 78  BILITOT 0.6 1.2  PROT 6.8 6.8  ALBUMIN 3.8 3.6   No results for input(s): LIPASE, AMYLASE in the last 168 hours.  Recent Labs Lab 10/16/15 1108  AMMONIA 13   Coagulation Profile: No results for input(s): INR, PROTIME in the last 168 hours. Cardiac Enzymes: No results for input(s): CKTOTAL, CKMB, CKMBINDEX, TROPONINI in the last 168 hours. BNP (last 3 results) No results for input(s): PROBNP in the last 8760 hours. HbA1C: No results for input(s): HGBA1C in the last 72 hours. CBG:  Recent Labs Lab 10/16/15 1030 10/16/15 1621 10/16/15 1952 10/17/15 0028 10/17/15 0355  GLUCAP 313* 81 207* 132* 130*   Lipid Profile: No results for input(s): CHOL, HDL, LDLCALC, TRIG, CHOLHDL, LDLDIRECT in the last 72 hours. Thyroid Function Tests: No results for input(s): TSH, T4TOTAL, FREET4, T3FREE, THYROIDAB in the last 72 hours. Anemia Panel: No results for input(s): VITAMINB12, FOLATE, FERRITIN, TIBC, IRON, RETICCTPCT in the last 72 hours. Sepsis Labs:  Recent Labs Lab 10/16/15 1049  LATICACIDVEN 1.78    Recent Results (from the past 240 hour(s))  Blood Culture (routine x 2)     Status: None (Preliminary result)   Collection Time: 10/16/15 10:40 AM  Result Value Ref Range Status   Specimen Description BLOOD BLOOD LEFT FOREARM  Final   Special Requests BOTTLES DRAWN AEROBIC AND ANAEROBIC 5CC  Final   Culture  Setup Time   Final    GRAM POSITIVE COCCI IN CHAINS ANAEROBIC BOTTLE ONLY Organism ID to follow Performed at [facility]    Culture PENDING  Incomplete   Report Status PENDING  Incomplete  MRSA PCR Screening     Status: None    Collection Time: 10/16/15  1:50 PM  Result Value Ref Range Status   MRSA by PCR NEGATIVE NEGATIVE Final    Comment:        The GeneXpert MRSA Assay (FDA approved for NASAL specimens only), is one component of a comprehensive MRSA colonization surveillance program. It is not intended to diagnose MRSA infection nor to guide or monitor treatment for MRSA infections.          Radiology Studies: Dg Chest 2 View  Result Date: 10/16/2015 CLINICAL DATA:  82 year old male with a history of lethargy, code sepsis, not responding EXAM: CHEST  2 VIEW COMPARISON:  08/20/2015, 06/29/2015 FINDINGS: Cardiomediastinal silhouette unchanged. Calcifications of the aorta. Low lung volumes crowding the interstitium and the central vasculature. Lung apices not well evaluated given the overlying soft tissues of the neck. Linear opacities  at the lung bases. No pleural effusion or pneumothorax. IMPRESSION: Low lung volumes with linear basilar opacities, potentially represent atelectasis and/or consolidation. Aortic atherosclerosis. Signed, Yvone Neu. Loreta Ave, DO [facility] Radiology Electronically Signed   By: Gilmer Mor D.O.   On: 10/16/2015 11:08   Ct Head Wo Contrast  Result Date: 10/16/2015 CLINICAL DATA:  82 year old male with a history of lethargy EXAM: CT HEAD WITHOUT CONTRAST TECHNIQUE: Contiguous axial images were obtained from the base of the skull through the vertex without intravenous contrast. COMPARISON:  10/09/2009, CT 08/20/2015 FINDINGS: Unremarkable appearance of the calvarium without acute fracture or aggressive lesion. Unremarkable appearance of the scalp soft tissues. Unremarkable appearance of the orbits. Visualized aspects of the right maxillary sinus demonstrate persisting complete opacification with high density material. As was seen on the comparison CT of 08/20/2015, there is expansion of the medial maxillary wall. Left maxillary sinuses  clear. No acute intracranial hemorrhage. No midline shift or mass effect. Gray-white differentiation is maintained. Mild volume loss. Unchanged configuration of the ventricles. Intracranial atherosclerosis. IMPRESSION: No CT evidence of acute intracranial abnormality. Complete opacification of the partially visualized maxillary sinus with high density material. Given today's appearance and the appearance on prior CT 08/20/2015, differential diagnosis should include mucocele with remodeling of the sinus, chronic obstruction with inspissated secretions, and fungal colonization. Less likely, the findings may be related to dentigerous cyst or other dental process. ENT evaluation may be considered. These results were called by telephone at the time of interpretation on 10/16/2015 at 11:25 am to Dr. Melene Plan , who verbally acknowledged these results. Signed, Yvone Neu. Loreta Ave, DO [facility] Radiology Electronically Signed   By: Gilmer Mor D.O.   On: 10/16/2015 11:28        Scheduled Meds: . azithromycin  500 mg Oral Q24H  . enoxaparin (LOVENOX) injection  40 mg Subcutaneous Q24H  . fluticasone  1 spray Each Nare BID  . folic acid  1 mg Oral Daily  . insulin aspart  0-9 Units Subcutaneous Q4H  . losartan  25 mg Oral q morning - 10a  . metoprolol  50 mg Oral BID  . multivitamin with minerals  1 tablet Oral Daily  . OLANZapine  5 mg Intramuscular QHS  . sodium chloride flush  3 mL Intravenous Q12H  . thiamine  100 mg Oral Daily  . traZODone  50 mg Oral QHS   Continuous Infusions: . sodium chloride 100 mL/hr at 10/17/15 0024     LOS: 1 day      Coralie Keens, MD [facility] Pager [phone]  If 7PM-7AM, please contact night-coverage [email or web address] Password [email or web address] 10/17/2015, 8:01 AM

## 2015-10-17 NOTE — Progress Notes (Addendum)
Patient agitation continuing to increase, PRN anxiety medications agiven.  Staffing unable to provide a Recruitment consultantsafety sitter.  Pt constantly trying to get up out of bed and pulling of telemetry leads and  IV.  MD made aware.  Daughter, stephanie called to make aware of situation.  Daughter agreed to come sit with her dad.  MD gave verbal order for a soft waist restraint for patient safety.  Floor mats placed in the floor and patient moved to a low bed for patient safety.  Bed alarm on.

## 2015-10-17 NOTE — Evaluation (Signed)
Physical Therapy Evaluation Patient Details Name: Charles Scott MRN: [id number] DOB: 12-21-1930 Today's Date: 10/17/2015   History of Present Illness  82 y.o. male with medical history significant for dementia who presents to hospital with altered mental status, metabolic encephalopathy  Clinical Impression  Pt admitted with above diagnosis. Pt currently with functional limitations due to the deficits listed below (see PT Problem List).  Pt will benefit from skilled PT to increase their independence and safety with mobility to allow discharge to the venue listed below.  Pt requiring multimodal cues and assist for ambulation due to decreased cognition.  Recommend 24/7 assist upon d/c.  Per notes, pt was just released from SNF and family wanting memory care unit.     Follow Up Recommendations Home health PT;Supervision/Assistance - 24 hour (per notes, family considering ALF?)    Equipment Recommendations  Rolling [place] with 5" wheels    Recommendations for Other Services       Precautions / Restrictions Precautions Precautions: Fall      Mobility  Bed Mobility Overal bed mobility: Needs Assistance Bed Mobility: Supine to Sit     Supine to sit: Mod assist     General bed mobility comments: multimodal cues  Transfers Overall transfer level: Needs assistance Equipment used: Rolling Takagi (2 wheeled) Transfers: Sit to/from Stand Sit to Stand: Min assist;+2 physical assistance;+2 safety/equipment         General transfer comment: multimodal cues for safety  Ambulation/Gait Ambulation/Gait assistance: Min assist;+2 physical assistance;+2 safety/equipment Ambulation Distance (Feet): 100 Feet Assistive device: Rolling Bloch (2 wheeled) Gait Pattern/deviations: Step-through pattern;Trunk flexed     General Gait Details: multimodal cues for safe use of RW, assist to steady and occasionally negotiate RW  Stairs            Wheelchair Mobility    Modified Rankin  (Stroke Patients Only)       Balance                                             Pertinent Vitals/Pain Pain Assessment: Faces Faces Pain Scale: No hurt    Home Living   Living Arrangements: Spouse/significant other               Additional Comments: pt poor historian, per notes, just finished at SNF and family requesting memory care unit?    Prior Function           Comments: ambulatory however unknown assist level as pt poor historian     Hand Dominance        Extremity/Trunk Assessment               Lower Extremity Assessment: Generalized weakness;Difficult to assess due to impaired cognition         Communication   Communication: No difficulties  Cognition Arousal/Alertness: Awake/alert Behavior During Therapy: Restless Overall Cognitive Status: Impaired/Different from baseline Area of Impairment: Following commands;Safety/judgement       Following Commands: Follows one step commands inconsistently Safety/Judgement: Decreased awareness of deficits;Decreased awareness of safety     General Comments: able to state name otherwise disorientated, requiring multimodal cues    General Comments      Exercises        Assessment/Plan    PT Assessment Patient needs continued PT services  PT Diagnosis Difficulty walking;Altered mental status   PT Problem List Decreased strength;Decreased activity  tolerance;Decreased mobility;Decreased cognition;Decreased knowledge of use of DME;Decreased safety awareness  PT Treatment Interventions DME instruction;Gait training;Functional mobility training;Therapeutic exercise;Therapeutic activities;Patient/family education;Balance training   PT Goals (Current goals can be found in the Care Plan section) Acute Rehab PT Goals PT Goal Formulation: Patient unable to participate in goal setting Time For Goal Achievement: 10/24/15 Potential to Achieve Goals: Good    Frequency Min 3X/week    Barriers to discharge        Co-evaluation               End of Session Equipment Utilized During Treatment: Gait belt Activity Tolerance: Patient tolerated treatment well Patient left: in bed;with call bell/phone within reach;with nursing/sitter in room Nurse Communication: Mobility status         Time: [id number] PT Time Calculation (min) (ACUTE ONLY): 19 min   Charges:   PT Evaluation $PT Eval Low Complexity: 1 Procedure     PT G Codes:        Clayton Bosserman,KATHrine E 10/17/2015, 3:20 PM Zenovia JarredKati Jaisa Defino, PT, DPT 10/17/2015 Pager: [phone]

## 2015-10-18 DIAGNOSIS — R404 Transient alteration of awareness: Secondary | ICD-10-CM

## 2015-10-18 LAB — BASIC METABOLIC PANEL
Anion gap: 8 (ref 5–15)
BUN: 10 mg/dL (ref 6–20)
CHLORIDE: 104 mmol/L (ref 101–111)
CO2: 26 mmol/L (ref 22–32)
Calcium: 9.4 mg/dL (ref 8.9–10.3)
Creatinine, Ser: 1.33 mg/dL — ABNORMAL HIGH (ref 0.61–1.24)
GFR calc non Af Amer: 48 mL/min — ABNORMAL LOW (ref >60)
GFR, EST AFRICAN AMERICAN: 56 mL/min — AB (ref >60)
Glucose, Bld: 131 mg/dL — ABNORMAL HIGH (ref 65–99)
POTASSIUM: 3.5 mmol/L (ref 3.5–5.1)
SODIUM: 138 mmol/L (ref 135–145)

## 2015-10-18 LAB — CBC WITH DIFFERENTIAL/PLATELET
BASOS PCT: 0 %
Basophils Absolute: 0 10*3/uL (ref 0.0–0.1)
EOS ABS: 0.4 10*3/uL (ref 0.0–0.7)
Eosinophils Relative: 5 %
HCT: 42.4 % (ref 39.0–52.0)
HEMOGLOBIN: 15.1 g/dL (ref 13.0–17.0)
LYMPHS ABS: 1.3 10*3/uL (ref 0.7–4.0)
Lymphocytes Relative: 17 %
MCH: 33.6 pg (ref 26.0–34.0)
MCHC: 35.6 g/dL (ref 30.0–36.0)
MCV: 94.2 fL (ref 78.0–100.0)
Monocytes Absolute: 0.8 10*3/uL (ref 0.1–1.0)
Monocytes Relative: 10 %
NEUTROS ABS: 5.3 10*3/uL (ref 1.7–7.7)
NEUTROS PCT: 68 %
Platelets: 169 10*3/uL (ref 150–400)
RBC: 4.5 MIL/uL (ref 4.22–5.81)
RDW: 14 % (ref 11.5–15.5)
WBC: 7.7 10*3/uL (ref 4.0–10.5)

## 2015-10-18 LAB — GLUCOSE, CAPILLARY
GLUCOSE-CAPILLARY: 112 mg/dL — AB (ref 65–99)
GLUCOSE-CAPILLARY: 123 mg/dL — AB (ref 65–99)
GLUCOSE-CAPILLARY: 133 mg/dL — AB (ref 65–99)
GLUCOSE-CAPILLARY: 140 mg/dL — AB (ref 65–99)
Glucose-Capillary: 145 mg/dL — ABNORMAL HIGH (ref 65–99)
Glucose-Capillary: 128 mg/dL — ABNORMAL HIGH (ref 65–99)
Glucose-Capillary: 141 mg/dL — ABNORMAL HIGH (ref 65–99)

## 2015-10-18 NOTE — Progress Notes (Signed)
PROGRESS NOTE    Charles Scott  ZOX:[id number] DOB: [birth year] DOA: 10/16/2015 PCP: Fredirick Maudlin, MD   Brief Narrative:  Metabolic encephalopathy with delirium. 82 yo male with moderate dementia, on antipsychotics at home. Presented with acute decompensation. Patient's family requesting patient to be placed in am memory unit for safety.   Assessment & Plan: 1. Metabolic encephalopathy. Patient still disorientated, agitated, unable to follow commands clearly. Will continue regimen with pm olanzapine and trazodone, as needed lorazepam and haldol.  Follow on neurology recommendations. Continue supportive care with IV fluids and glycemic control. Continue thiamine, folic acid and multivitamins. Physical therapy evaluation done, ALF and supervision recommended. SW consulted for assistance.   2. Maxillary sinusitis. Will continue azithromycin, fluticasone and saline nasal spray   3. HTN, essential. continue on metoprolol for now, continue losartan. OK to keep hydralazine as needed.   4. T2DM with complications of nephropathy. Will continue insulin sliding scale, continue to hold on oral hypoglycemic agents. Target serum glucose in the hospital 140 to 180 mg/dl.  5. CKD. Calculated GFR 37 c/w stage III. Noted Na at 143, Cl 113, will change fluids to hypotonic saline at 75 cc/hr to prevent worsening hypernatremia and hyperchloremia. BMP in AM  Patient continue at moderate risk for worsening encephalopathy.   DVT prophylaxis: lovenox Code Status: full Family Communication: No family at bedside.   Disposition Plan: May need memory unit or assisted living. SW consulted   Consultants:   Neurology  Antimicrobials:   Azithromycin #1   Subjective: Continue confusion, no chest pain or dyspnea.  Objective: Vitals:   10/16/15 2123 10/17/15 0704 10/17/15 2139 10/18/15 0552  BP: 123/89 (!) 130/103 (!) 143/76 (!) 141/82  Pulse: 82 80 86 96  Resp: 18 17    Temp: 98.6 F (37 C) 98.2 F (36.8  C) 98.5 F (36.9 C) 98.8 F (37.1 C)  TempSrc: Oral Oral Axillary Oral  SpO2: 100% 98%  100%  Weight:      Height:        Intake/Output Summary (Last 24 hours) at 10/18/15 1309 Last data filed at 10/18/15 0909  Gross per 24 hour  Intake           988.75 ml  Output             1260 ml  Net          -271.25 ml   Filed Weights   10/16/15 1227 10/16/15 1310  Weight: 81.6 kg (180 lb) 77.1 kg (169 lb 15.6 oz)    Examination:  General exam: deconditioned. Not in pain or dyspnea ENT: mild conjunctival pallor, no icterus, oral mucosa moist. Respiratory system: Respiratory effort normal. Mild decreased breath sounds at bases, no wheezing, Cardiovascular system: S1 & S2 heard, RRR. No JVD, murmurs, rubs, gallops or clicks. No pedal edema. Gastrointestinal system: Abdomen is nondistended, soft and nontender. No organomegaly or masses felt. Normal bowel sounds heard. Central nervous system: More somnolent this AM, moving all 4 extremities spont but has been in two point restraints    Data Reviewed: I have personally reviewed following labs and imaging studies  CBC:  Recent Labs Lab 10/16/15 1044 10/17/15 0452 10/18/15 0519  WBC 8.0 7.2 7.7  NEUTROABS 6.0  --  5.3  HGB 13.5 13.9 15.1  HCT 40.1 40.8 42.4  MCV 97.3 97.1 94.2  PLT 171 168 169   Basic Metabolic Panel:  Recent Labs Lab 10/16/15 1044 10/17/15 0452 10/18/15 0519  NA 136 143 138  K  4.4 3.4* 3.5  CL 108 113* 104  CO2 21* 25 26  GLUCOSE 349* 131* 131*  BUN 24* 13 10  CREATININE 1.73* 1.30* 1.33*  CALCIUM 9.2 9.1 9.4   Liver Function Tests:  Recent Labs Lab 10/16/15 1044 10/17/15 0452  AST 19 21  ALT 23 21  ALKPHOS 79 78  BILITOT 0.6 1.2  PROT 6.8 6.8  ALBUMIN 3.8 3.6    Recent Labs Lab 10/16/15 1108  AMMONIA 13   CBG:  Recent Labs Lab 10/17/15 2152 10/17/15 2354 10/18/15 0355 10/18/15 0802 10/18/15 1142  GLUCAP 148* 131* 128* 140* 141*   Sepsis Labs:  Recent Labs Lab  10/16/15 1049  LATICACIDVEN 1.78    Recent Results (from the past 240 hour(s))  Blood Culture (routine x 2)     Status: None (Preliminary result)   Collection Time: 10/16/15 10:30 AM  Result Value Ref Range Status   Specimen Description BLOOD BLOOD RIGHT FOREARM  Final   Special Requests BOTTLES DRAWN AEROBIC AND ANAEROBIC 5CC  Final   Culture   Final    NO GROWTH 1 DAY Performed at [facility]    Report Status PENDING  Incomplete  Urine culture     Status: None   Collection Time: 10/16/15 10:37 AM  Result Value Ref Range Status   Specimen Description URINE, RANDOM  Final   Special Requests NONE  Final   Culture NO GROWTH Performed at [facility]   Final   Report Status 10/17/2015 FINAL  Final  Blood Culture (routine x 2)     Status: None (Preliminary result)   Collection Time: 10/16/15 10:40 AM  Result Value Ref Range Status   Specimen Description BLOOD BLOOD LEFT FOREARM  Final   Special Requests BOTTLES DRAWN AEROBIC AND ANAEROBIC 5CC  Final   Culture  Setup Time   Final    GRAM POSITIVE COCCI IN CHAINS ANAEROBIC BOTTLE ONLY Organism ID to follow CRITICAL RESULT CALLED TO, READ BACK BY AND VERIFIED WITH: J LEGGE 10/17/15 @ 0921 M VESTAL Performed at [facility]    Culture GRAM POSITIVE COCCI  Final   Report Status PENDING  Incomplete  Blood Culture ID Panel (Reflexed)     Status: Abnormal   Collection Time: 10/16/15 10:40 AM  Result Value Ref Range Status   Enterococcus species NOT DETECTED NOT DETECTED Corrected   Listeria monocytogenes NOT DETECTED NOT DETECTED Corrected   Staphylococcus species NOT DETECTED NOT DETECTED Corrected   Staphylococcus aureus NOT DETECTED NOT DETECTED Corrected   Streptococcus species DETECTED (A) NOT DETECTED Corrected    Comment: CRITICAL RESULT CALLED TO, READ BACK BY AND VERIFIED WITH: J LEGGE 10/17/15 @ 0921 M VESTAL    Streptococcus agalactiae NOT DETECTED NOT DETECTED Corrected   Streptococcus pneumoniae  NOT DETECTED NOT DETECTED Corrected   Streptococcus pyogenes NOT DETECTED NOT DETECTED Corrected   Acinetobacter baumannii NOT DETECTED NOT DETECTED Corrected   Enterobacteriaceae species NOT DETECTED NOT DETECTED Corrected   Enterobacter cloacae complex NOT DETECTED NOT DETECTED Corrected   Escherichia coli NOT DETECTED NOT DETECTED Corrected   Klebsiella oxytoca NOT DETECTED NOT DETECTED Corrected   Klebsiella pneumoniae NOT DETECTED NOT DETECTED Corrected   Proteus species NOT DETECTED NOT DETECTED Corrected   Serratia marcescens NOT DETECTED NOT DETECTED Corrected   Haemophilus influenzae NOT DETECTED NOT DETECTED Corrected   Neisseria meningitidis NOT DETECTED NOT DETECTED Corrected   Pseudomonas aeruginosa NOT DETECTED NOT DETECTED Corrected   Candida albicans NOT DETECTED NOT  DETECTED Corrected   Candida glabrata NOT DETECTED NOT DETECTED Corrected   Candida krusei NOT DETECTED NOT DETECTED Corrected   Candida parapsilosis NOT DETECTED NOT DETECTED Corrected   Candida tropicalis NOT DETECTED NOT DETECTED Corrected    Comment[facility]  MRSA PCR Screening     Status: None   Collection Time: 10/16/15  1:50 PM  Result Value Ref Range Status   MRSA by PCR NEGATIVE NEGATIVE Final     Radiology Studies: Dg Chest Port 1 View  Result Date: 10/17/2015 CLINICAL DATA:  Altered mental status, progressive deterioration, history of encephalopathy and dementia. Also diabetes, current smoker. EXAM: PORTABLE CHEST 1 VIEW COMPARISON:  PA and lateral chest x-ray of October 16, 2015 FINDINGS: The lungs are well-expanded and clear. The heart and pulmonary vascularity are normal. The mediastinum is normal in width. There is calcification in the wall of the aortic arch. The bony thorax exhibits no acute abnormality. IMPRESSION: There is no active cardiopulmonary disease. Aortic atherosclerosis. Electronically Signed   By: David  SwazilandJordan M.D.   On: 10/17/2015 11:07   Scheduled  Meds: . azithromycin  500 mg Oral Q24H  . enoxaparin (LOVENOX) injection  40 mg Subcutaneous Q24H  . fluticasone  1 spray Each Nare BID  . folic acid  1 mg Oral Daily  . insulin aspart  0-9 Units Subcutaneous Q4H  . losartan  25 mg Oral q morning - 10a  . metoprolol  50 mg Oral BID  . multivitamin with minerals  1 tablet Oral Daily  . OLANZapine  5 mg Intramuscular QHS  . sodium chloride flush  3 mL Intravenous Q12H  . thiamine  100 mg Oral Daily  . traZODone  50 mg Oral QHS   Continuous Infusions: . sodium chloride 75 mL/hr at 10/18/15 1209    LOS: 2 days   Debbora PrestoMAGICK-Aydon Swamy, MD [facility] Pager [phone]  If 7PM-7AM, please contact night-coverage [email or web address] Password [email or web address] 10/18/2015, 1:09 PM

## 2015-10-18 NOTE — Progress Notes (Signed)
Pt had 10 beats of SVT. Patient was asleep. VS stable. MD paged. Charles Scott. Shavell Nored RN

## 2015-10-18 NOTE — Progress Notes (Signed)
Patient has been sleeping/very lethargic all shift due to Haldol. Waist restraint is not longer needed if Haldol continues to be effective. Restraint ordered ended and waist restraint removed. Julio SicksK. Riddick Nuon RN

## 2015-10-19 ENCOUNTER — Inpatient Hospital Stay (HOSPITAL_COMMUNITY): Payer: Medicare Other

## 2015-10-19 LAB — BASIC METABOLIC PANEL
ANION GAP: 7 (ref 5–15)
BUN: 17 mg/dL (ref 6–20)
CALCIUM: 9.2 mg/dL (ref 8.9–10.3)
CHLORIDE: 105 mmol/L (ref 101–111)
CO2: 26 mmol/L (ref 22–32)
CREATININE: 1.41 mg/dL — AB (ref 0.61–1.24)
GFR calc non Af Amer: 45 mL/min — ABNORMAL LOW (ref >60)
GFR, EST AFRICAN AMERICAN: 52 mL/min — AB (ref >60)
Glucose, Bld: 204 mg/dL — ABNORMAL HIGH (ref 65–99)
Potassium: 4 mmol/L (ref 3.5–5.1)
SODIUM: 138 mmol/L (ref 135–145)

## 2015-10-19 LAB — GLUCOSE, CAPILLARY
GLUCOSE-CAPILLARY: 275 mg/dL — AB (ref 65–99)
Glucose-Capillary: 190 mg/dL — ABNORMAL HIGH (ref 65–99)
Glucose-Capillary: 167 mg/dL — ABNORMAL HIGH (ref 65–99)
Glucose-Capillary: 169 mg/dL — ABNORMAL HIGH (ref 65–99)
Glucose-Capillary: 162 mg/dL — ABNORMAL HIGH (ref 65–99)

## 2015-10-19 LAB — CBC
HCT: 40.9 % (ref 39.0–52.0)
HEMOGLOBIN: 14.3 g/dL (ref 13.0–17.0)
MCH: 32.9 pg (ref 26.0–34.0)
MCHC: 35 g/dL (ref 30.0–36.0)
MCV: 94.2 fL (ref 78.0–100.0)
Platelets: 181 10*3/uL (ref 150–400)
RBC: 4.34 MIL/uL (ref 4.22–5.81)
RDW: 13.9 % (ref 11.5–15.5)
WBC: 8.1 10*3/uL (ref 4.0–10.5)

## 2015-10-19 LAB — CULTURE, BLOOD (ROUTINE X 2)

## 2015-10-19 MED ORDER — THIAMINE HCL 100 MG/ML IJ SOLN
100.0000 mg | Freq: Every day | INTRAMUSCULAR | Status: DC
  Administered 2015-10-20 – 2015-10-23 (×2): 100 mg via INTRAVENOUS
  Filled 2015-10-19 (×2): qty 2

## 2015-10-19 MED ORDER — LORAZEPAM 2 MG/ML IJ SOLN
1.0000 mg | Freq: Four times a day (QID) | INTRAMUSCULAR | Status: AC | PRN
  Administered 2015-10-19 – 2015-10-21 (×2): 1 mg via INTRAVENOUS
  Filled 2015-10-19 (×3): qty 1

## 2015-10-19 MED ORDER — ADULT MULTIVITAMIN W/MINERALS CH: 1.0000 | ORAL_TABLET | Freq: Every day | ORAL | Status: DC

## 2015-10-19 MED ORDER — LORAZEPAM 2 MG/ML IJ SOLN
0.0000 mg | Freq: Two times a day (BID) | INTRAMUSCULAR | Status: AC
  Administered 2015-10-22: 2 mg via INTRAVENOUS

## 2015-10-19 MED ORDER — LORAZEPAM 1 MG PO TABS: 1.0000 mg | ORAL_TABLET | Freq: Four times a day (QID) | ORAL | Status: AC | PRN

## 2015-10-19 MED ORDER — FOLIC ACID 1 MG PO TABS: 1.0000 mg | ORAL_TABLET | Freq: Every day | ORAL | Status: DC

## 2015-10-19 MED ORDER — VITAMIN B-1 100 MG PO TABS
100.0000 mg | ORAL_TABLET | Freq: Every day | ORAL | Status: DC
  Filled 2015-10-19: qty 1

## 2015-10-19 MED ORDER — LORAZEPAM 2 MG/ML IJ SOLN
0.0000 mg | Freq: Four times a day (QID) | INTRAMUSCULAR | Status: AC
Start: 2015-10-19 — End: 2015-10-21
  Administered 2015-10-19 (×2): 2 mg via INTRAVENOUS
  Administered 2015-10-21: 1 mg via INTRAVENOUS
  Filled 2015-10-19 (×3): qty 1

## 2015-10-19 NOTE — Plan of Care (Signed)
Problem: Safety: Goal: Ability to remain free from injury will improve Outcome: Not Progressing Pt still attempting to get out of bed without supervision or assistance  Problem: Fluid Volume: Goal: Ability to maintain a balanced intake and output will improve Outcome: Not Progressing Poor PO intake  Problem: Nutrition: Goal: Adequate nutrition will be maintained Outcome: Not Progressing Poor PO intake

## 2015-10-19 NOTE — Progress Notes (Addendum)
Error

## 2015-10-19 NOTE — Progress Notes (Signed)
PROGRESS NOTE    Charles CraftsJohn M Scott  XBM:[id number] DOB: 05/17/1930 DOA: 10/16/2015 PCP: Fredirick MaudlinHAWKINS,EDWARD L, MD   Brief Narrative:  Metabolic encephalopathy with delirium. 82 yo male with moderate dementia, on antipsychotics at home. Presented with acute decompensation. Patient's family requesting patient to be placed in am memory unit for safety.   Assessment & Plan: 1. Metabolic encephalopathy. Unclear etiology, ? Progressive dementia vs underlying infection. Patient still disorientated, agitated, unable to follow commands clearly. Not sure that maxillary sinusitis really causing this. There was mention of ? PNA on admission so I think it is reasonable to obtain CT chest to rule out aspiration PNA. Will continue regimen with pm olanzapine and trazodone, as needed lorazepam and haldol.  Neurology team has no further recommendations. Physical therapy evaluation done, ALF and supervision recommended. SW consulted for assistance. Pt will have to pay out of pocket and that will be a problem in terms of placement. Will also request SLP evaluation.   2. Maxillary sinusitis. Will continue azithromycin, fluticasone and saline nasal spray   3. HTN, essential. continue on metoprolol for now, continue losartan. OK to keep hydralazine as needed.   4. T2DM with complications of nephropathy. Will continue insulin sliding scale, continue to hold on oral hypoglycemic agents. Target serum glucose in the hospital 140 to 180 mg/dl.  5. CKD. Calculated GFR 37 c/w stage III. Noted Na at 143, Cl 113, will change fluids to hypotonic saline at 75 cc/hr to prevent worsening hypernatremia and hyperchloremia. BMP in AM  Goals of care - spoke with daughter Judeth CornfieldStephanie - plan for today is CT chest and SLp to rule out aspiration. [facility], pending the results of scan and SLP eval.   Patient continue at moderate risk for worsening encephalopathy.   DVT prophylaxis: lovenox Code  Status: DNR Family Communication: daughter Judeth CornfieldStephanie over the phone  Disposition Plan: [place] home as unable to pay for ALF  Consultants:   Neurology  Antimicrobials:   Azithromycin 9/4 -->  Subjective: Continue confusion, no chest pain or dyspnea.  Objective: Vitals:   10/18/15 1354 10/18/15 2102 10/19/15 0457 10/19/15 1300  BP: (!) 133/94 (!) 141/85 122/77 (!) 147/76  Pulse: 89 (!) 125 82 95  Resp: 16 18 18 18   Temp: 97.7 F (36.5 C) 98.3 F (36.8 C) 98.1 F (36.7 C) 98.8 F (37.1 C)  TempSrc: Oral Oral Oral Oral  SpO2: 100% 100% 100% 98%  Weight:      Height:        Intake/Output Summary (Last 24 hours) at 10/19/15 1605 Last data filed at 10/19/15 1500  Gross per 24 hour  Intake          2433.75 ml  Output              450 ml  Net          1983.75 ml   Filed Weights   10/16/15 1227 10/16/15 1310  Weight: 81.6 kg (180 lb) 77.1 kg (169 lb 15.6 oz)    Examination:  General exam: deconditioned. Not in pain or dyspnea ENT: mild conjunctival pallor, no icterus, oral mucosa moist. Respiratory system: Respiratory effort normal. Mild decreased breath sounds at bases, no wheezing, Cardiovascular system: S1 & S2 heard, RRR. No JVD, murmurs, rubs, gallops or clicks. No pedal edema. Gastrointestinal system: Abdomen is nondistended, soft and nontender. No organomegaly or masses felt. Normal bowel sounds heard. Central nervous system: More somnolent this AM, moving all 4 extremities  spont but has been in two point restraints    Data Reviewed: I have personally reviewed following labs and imaging studies  CBC:  Recent Labs Lab 10/16/15 1044 10/17/15 0452 10/18/15 0519 10/19/15 0453  WBC 8.0 7.2 7.7 8.1  NEUTROABS 6.0  --  5.3  --   HGB 13.5 13.9 15.1 14.3  HCT 40.1 40.8 42.4 40.9  MCV 97.3 97.1 94.2 94.2  PLT 171 168 169 181   Basic Metabolic Panel:  Recent Labs Lab 10/16/15 1044 10/17/15 0452 10/18/15 0519 10/19/15 0453  NA 136 143 138 138  K 4.4  3.4* 3.5 4.0  CL 108 113* 104 105  CO2 21* 25 26 26   GLUCOSE 349* 131* 131* 204*  BUN 24* 13 10 17   CREATININE 1.73* 1.30* 1.33* 1.41*  CALCIUM 9.2 9.1 9.4 9.2   Liver Function Tests:  Recent Labs Lab 10/16/15 1044 10/17/15 0452  AST 19 21  ALT 23 21  ALKPHOS 79 78  BILITOT 0.6 1.2  PROT 6.8 6.8  ALBUMIN 3.8 3.6    Recent Labs Lab 10/16/15 1108  AMMONIA 13   CBG:  Recent Labs Lab 10/18/15 2019 10/18/15 2344 10/19/15 0453 10/19/15 0723 10/19/15 1144  GLUCAP 123* 112* 190* 167* 275*   Sepsis Labs:  Recent Labs Lab 10/16/15 1049  LATICACIDVEN 1.78    Recent Results (from the past 240 hour(s))  Blood Culture (routine x 2)     Status: None (Preliminary result)   Collection Time: 10/16/15 10:30 AM  Result Value Ref Range Status   Specimen Description BLOOD BLOOD RIGHT FOREARM  Final   Special Requests BOTTLES DRAWN AEROBIC AND ANAEROBIC 5CC  Final   Culture   Final    NO GROWTH 1 DAY Performed at [facility]    Report Status PENDING  Incomplete  Urine culture     Status: None   Collection Time: 10/16/15 10:37 AM  Result Value Ref Range Status   Specimen Description URINE, RANDOM  Final   Special Requests NONE  Final   Culture NO GROWTH Performed at [facility]   Final   Report Status 10/17/2015 FINAL  Final  Blood Culture (routine x 2)     Status: None (Preliminary result)   Collection Time: 10/16/15 10:40 AM  Result Value Ref Range Status   Specimen Description BLOOD BLOOD LEFT FOREARM  Final   Special Requests BOTTLES DRAWN AEROBIC AND ANAEROBIC 5CC  Final   Culture  Setup Time   Final    GRAM POSITIVE COCCI IN CHAINS ANAEROBIC BOTTLE ONLY Organism ID to follow CRITICAL RESULT CALLED TO, READ BACK BY AND VERIFIED WITH: J LEGGE 10/17/15 @ 0921 M VESTAL Performed at [facility]    Culture GRAM POSITIVE COCCI  Final   Report Status PENDING  Incomplete  Blood Culture ID Panel (Reflexed)     Status: Abnormal   Collection  Time: 10/16/15 10:40 AM  Result Value Ref Range Status   Enterococcus species NOT DETECTED NOT DETECTED Corrected   Listeria monocytogenes NOT DETECTED NOT DETECTED Corrected   Staphylococcus species NOT DETECTED NOT DETECTED Corrected   Staphylococcus aureus NOT DETECTED NOT DETECTED Corrected   Streptococcus species DETECTED (A) NOT DETECTED Corrected    Comment: CRITICAL RESULT CALLED TO, READ BACK BY AND VERIFIED WITH: J LEGGE 10/17/15 @ 0921 M VESTAL    Streptococcus agalactiae NOT DETECTED NOT DETECTED Corrected   Streptococcus pneumoniae NOT DETECTED NOT DETECTED Corrected   Streptococcus pyogenes NOT DETECTED NOT DETECTED  Corrected   Acinetobacter baumannii NOT DETECTED NOT DETECTED Corrected   Enterobacteriaceae species NOT DETECTED NOT DETECTED Corrected   Enterobacter cloacae complex NOT DETECTED NOT DETECTED Corrected   Escherichia coli NOT DETECTED NOT DETECTED Corrected   Klebsiella oxytoca NOT DETECTED NOT DETECTED Corrected   Klebsiella pneumoniae NOT DETECTED NOT DETECTED Corrected   Proteus species NOT DETECTED NOT DETECTED Corrected   Serratia marcescens NOT DETECTED NOT DETECTED Corrected   Haemophilus influenzae NOT DETECTED NOT DETECTED Corrected   Neisseria meningitidis NOT DETECTED NOT DETECTED Corrected   Pseudomonas aeruginosa NOT DETECTED NOT DETECTED Corrected   Candida albicans NOT DETECTED NOT DETECTED Corrected   Candida glabrata NOT DETECTED NOT DETECTED Corrected   Candida krusei NOT DETECTED NOT DETECTED Corrected   Candida parapsilosis NOT DETECTED NOT DETECTED Corrected   Candida tropicalis NOT DETECTED NOT DETECTED Corrected    Comment: Performed at [facility]  MRSA PCR Screening     Status: None   Collection Time: 10/16/15  1:50 PM  Result Value Ref Range Status   MRSA by PCR NEGATIVE NEGATIVE Final     Radiology Studies: No results found. Scheduled Meds: . azithromycin  500 mg Oral Q24H  . enoxaparin (LOVENOX) injection  40 mg  Subcutaneous Q24H  . fluticasone  1 spray Each Nare BID  . folic acid  1 mg Oral Daily  . insulin aspart  0-9 Units Subcutaneous Q4H  . LORazepam  0-4 mg Intravenous Q6H   Followed by  . [START ON 10/21/2015] LORazepam  0-4 mg Intravenous Q12H  . losartan  25 mg Oral q morning - 10a  . metoprolol  50 mg Oral BID  . multivitamin with minerals  1 tablet Oral Daily  . OLANZapine  5 mg Intramuscular QHS  . sodium chloride flush  3 mL Intravenous Q12H  . [START ON 10/20/2015] thiamine  100 mg Oral Daily   Or  . [START ON 10/20/2015] thiamine  100 mg Intravenous Daily  . traZODone  50 mg Oral QHS   Continuous Infusions:    LOS: 3 days   Debbora Presto, MD [facility] Pager [phone]  If 7PM-7AM, please contact night-coverage [email or web address] Password [email or web address] 10/19/2015, 4:05 PM

## 2015-10-19 NOTE — Clinical Social Work Note (Signed)
Clinical Social Work Assessment  Patient Details  Name: Charles CraftsJohn M Scott MRN: [id number] Date of Birth: 02/09/1931  Date of referral:  10/19/15               Reason for consult:  Facility Placement, Discharge Planning                Permission sought to share information with:  Family Supports, Magazine features editoracility Contact Representative, Case Estate manager/land agentManager Permission granted to share information::  Yes, Verbal Permission Granted  Name::      Judeth Cornfield(Stephanie Wall )  Agency::   ([facility] )  Relationship::   (Daughter )  Contact Information:   [phone])  Housing/Transportation Living arrangements for the past 2 months:  Single Family Home Source of Information:  Adult Children Patient Interpreter Needed:  None Criminal Activity/Legal Involvement Pertinent to Current Situation/Hospitalization:  No - Comment as needed Significant Relationships:  Adult Children, Spouse, Other Family Members Lives with:  Spouse Do you feel safe going back to the place where you live?  Yes Need for family participation in patient care:  Yes (Comment)  Care giving concerns:  PMH: dementia. Patient is from home with spouse who is a retired LawyerCNA. Patient with AMS and requiring increased assistance and supervision.    Social Worker assessment / plan:  MSW spoke with patient's dtr, Stephanie at length in reference to post-acute placement for ALF. MSW introduced MSW role and ALF placement process. Patient's dtr reported that prior to hospitalization patient was at home with spouse and a part-time care giver during the day. Recently, likely due to infection patient has not been sleeping through the night and spouse is up all night caring for patient.   Pt's dtr further reported family is considering ALF/ memory care however remains unsure. Pt's dtr stated [facility] however discovered patient does not qualify for Medicaid and is aware ALF placement will be out-of-pocket. Pt's  dtr unsure how pt's spouse will survive financially if all money goes towards covering ALF placement. Pt's dtr interested in [facility]. MSW contacted [facility] director [facility](Stephan[facility] and left a message for a returned phone call.   Pt's dtr believes once patient's mental status clears, patient will be able to return home with family. Pt's dtr aware AMS is likely due to infection. Dtr requesting meeting with MD and medical updates as necessary.   Employment status:  Retired Database administratornsurance information:  Managed Medicare PT Recommendations:  Home with Home Health Information / Referral to community resources:   (ALF )  Patient/Family's Response to care: Patient currently with AMS. Pt's dtr unsure of disposition plan at this time. Pt's dtr would like for pt to return home if mental status improves. Pt's dtr unsure if family can afford private pay for ALF placement.   Patient/Family's Understanding of and Emotional Response to Diagnosis, Current Treatment, and Prognosis:  Pt's dtr aware that neurology has signed off indicating infection could be reason for AMS. Pt's dtr unsure of prognosis and asking how severe dementia is. Pt's dtr further reported if patient's mentation improves to baseline, family would like for him to return home. Dtr to coordinate time to meet with MD to discuss palliative care, prognosis and continuity of care.   Emotional Assessment Appearance:  Appears stated age Attitude/Demeanor/Rapport:  Unable to Assess Affect (typically observed):  Unable to Assess Orientation:  Oriented to Self Alcohol / Substance  use:  Not Applicable Psych involvement (Current and /or in the community):  No (Comment)  Discharge Needs  Concerns to be addressed:  Care Coordination, Cognitive Concerns Readmission within the last 30 days:  No Current discharge risk:  Cognitively Impaired Barriers to Discharge:  Continued Medical Work  up   [employer], MSW [phone] 10/19/2015 11:38 AM

## 2015-10-20 LAB — BASIC METABOLIC PANEL
Anion gap: 8 (ref 5–15)
BUN: 20 mg/dL (ref 6–20)
CHLORIDE: 106 mmol/L (ref 101–111)
CO2: 24 mmol/L (ref 22–32)
CREATININE: 1.42 mg/dL — AB (ref 0.61–1.24)
Calcium: 9.2 mg/dL (ref 8.9–10.3)
GFR calc non Af Amer: 44 mL/min — ABNORMAL LOW (ref >60)
GFR, EST AFRICAN AMERICAN: 52 mL/min — AB (ref >60)
GLUCOSE: 188 mg/dL — AB (ref 65–99)
Potassium: 4 mmol/L (ref 3.5–5.1)
Sodium: 138 mmol/L (ref 135–145)

## 2015-10-20 LAB — CBC
HEMATOCRIT: 41 % (ref 39.0–52.0)
HEMOGLOBIN: 13.9 g/dL (ref 13.0–17.0)
MCH: 32.6 pg (ref 26.0–34.0)
MCHC: 33.9 g/dL (ref 30.0–36.0)
MCV: 96.2 fL (ref 78.0–100.0)
Platelets: 180 10*3/uL (ref 150–400)
RBC: 4.26 MIL/uL (ref 4.22–5.81)
RDW: 14 % (ref 11.5–15.5)
WBC: 8.2 10*3/uL (ref 4.0–10.5)

## 2015-10-20 LAB — GLUCOSE, CAPILLARY
GLUCOSE-CAPILLARY: 138 mg/dL — AB (ref 65–99)
GLUCOSE-CAPILLARY: 170 mg/dL — AB (ref 65–99)
GLUCOSE-CAPILLARY: 176 mg/dL — AB (ref 65–99)
GLUCOSE-CAPILLARY: 184 mg/dL — AB (ref 65–99)
GLUCOSE-CAPILLARY: 213 mg/dL — AB (ref 65–99)
Glucose-Capillary: 131 mg/dL — ABNORMAL HIGH (ref 65–99)
Glucose-Capillary: 148 mg/dL — ABNORMAL HIGH (ref 65–99)

## 2015-10-20 MED ORDER — OLANZAPINE 5 MG PO TBDP
5.0000 mg | ORAL_TABLET | Freq: Every day | ORAL | Status: DC
  Administered 2015-10-23: 5 mg via ORAL
  Filled 2015-10-20 (×4): qty 1

## 2015-10-20 NOTE — Progress Notes (Signed)
PT Cancellation Note  Patient Details Name: Charles CraftsJohn M Scott MRN: [id number] DOB: 09/23/1930   Cancelled Treatment:    Reason Eval/Treat Not Completed: Fatigue/lethargy limiting ability to participate (pt sleeping soundly, per sitter pt was awake during the night, RN requested pt be allowed to rest now. Will attempt again this afternoon. )   Tamala SerUhlenberg, Catelin Manthe Kistler 10/20/2015, 11:09 AM [phone]

## 2015-10-20 NOTE — Care Management Important Message (Signed)
Important Message  Patient Details  Name: Charles CraftsJohn M Kiraly MRN: [id number] Date of Birth: 11/28/1930   Medicare Important Message Given:  Yes    Haskell FlirtJamison, Arden Axon 10/20/2015, 11:14 AMImportant Message  Patient Details  Name: Charles CraftsJohn M Jeremiah MRN: [id number] Date of Birth: 03/20/1930   Medicare Important Message Given:  Yes    Haskell FlirtJamison, Aamira Bischoff 10/20/2015, 11:14 AM

## 2015-10-20 NOTE — Progress Notes (Signed)
PT Cancellation Note  Patient Details Name: Rocky CraftsJohn M Whidbee MRN: [id number] DOB: 09/17/1930   Cancelled Treatment:    Reason Eval/Treat Not Completed: Fatigue/lethargy limiting ability to participate (pt sleeping soundly, did not arouse to verbal stimuli. Will attempt again tomorrow. )   Tamala SerUhlenberg, Jamilee Lafosse Kistler 10/20/2015, 12:51 PM [phone]

## 2015-10-20 NOTE — Progress Notes (Addendum)
PROGRESS NOTE    NICHOLES HIBLER  ZOX:[id number] DOB: 1931-01-20 DOA: 10/16/2015 PCP: Fredirick Maudlin, MD   Brief Narrative:  Metabolic encephalopathy with delirium. 82 yo male with moderate dementia, on antipsychotics at home. Presented with acute decompensation, possibly from sinusitis vs advancing dementia.  Assessment & Plan: 1. Metabolic encephalopathy. Unclear etiology, ? Progressive dementia vs underlying infection. Patient still disorientated, agitated, unable to follow commands clearly per report but very sleepy today and not arousable. Not sure that maxillary sinusitis really causing this. There was mention of ? PNA on admission so obtained CT chest to rule out aspiration PNA, only small effusions seen but incidental gastric mass seen on CT. Will continue regimen with pm olanzapine and trazodone, as needed lorazepam and haldol.  Neurology team has no further recommendations; may consider MRI of the brain to rule out CVA if he fails to improve, he is moving all extremities. Physical therapy evaluation done, ALF and supervision recommended. SW consulted for assistance. Pt will have to pay out of pocket and that will be a problem in terms of placement. Will also request SLP evaluation, which is pending his mental status.  Discussed with family that once he is stable will need EGD to evaluate possible gastric mass.  2. Maxillary sinusitis. Will continue azithromycin, fluticasone and saline nasal spray   3. HTN, essential. continue on metoprolol for now, continue losartan. OK to keep hydralazine as needed.   4. T2DM with complications of nephropathy. Will continue insulin sliding scale, continue to hold on oral hypoglycemic agents. Target serum glucose in the hospital 140 to 180 mg/dl.  5. CKD. Calculated GFR 37 c/w stage III. Noted Na at 143, Cl 113, will change fluids to hypotonic saline at 75 cc/hr to prevent worsening hypernatremia and hyperchloremia. BMP in AM  Patient continue at  moderate risk for worsening encephalopathy.   DVT prophylaxis: lovenox Code Status: DNR Family Communication: son present in room this AM  Disposition Plan: Likley home as unable to pay for ALF/SNF  Consultants:   Neurology  Antimicrobials:   Azithromycin 9/4 -->  Subjective: Patient sleeping soundly this morning, no acute events overnight, still requiring sitting at bedside this morning.  Objective: Vitals:   10/19/15 0457 10/19/15 1300 10/19/15 2018 10/20/15 0434  BP: 122/77 (!) 147/76 (!) 150/97 (!) 137/41  Pulse: 82 95 98 98  Resp: 18 18 18 18   Temp: 98.1 F (36.7 C) 98.8 F (37.1 C) 98.1 F (36.7 C) 97.8 F (36.6 C)  TempSrc: Oral Oral Axillary Axillary  SpO2: 100% 98% 94% 98%  Weight:      Height:        Intake/Output Summary (Last 24 hours) at 10/20/15 1328 Last data filed at 10/19/15 2026  Gross per 24 hour  Intake           393.75 ml  Output              350 ml  Net            43.75 ml   Filed Weights   10/16/15 1227 10/16/15 1310  Weight: 81.6 kg (180 lb) 77.1 kg (169 lb 15.6 oz)    Examination:  General exam: sleeping ENT: mild conjunctival pallor, no icterus, oral mucosa moist. Respiratory system: Respiratory effort normal. Mild decreased breath sounds at bases, no wheezing, Cardiovascular system: S1 & S2 heard, RRR. No JVD, murmurs, rubs, gallops or clicks. No pedal edema. Gastrointestinal system: Abdomen is nondistended, soft and nontender. No organomegaly or masses felt.  Normal bowel sounds heard. Central nervous system: Somnolent this AM, moving all 4 extremities spont but has been in two point restraints.   Data Reviewed: I have personally reviewed following labs and imaging studies  CBC:  Recent Labs Lab 10/16/15 1044 10/17/15 0452 10/18/15 0519 10/19/15 0453 10/20/15 0511  WBC 8.0 7.2 7.7 8.1 8.2  NEUTROABS 6.0  --  5.3  --   --   HGB 13.5 13.9 15.1 14.3 13.9  HCT 40.1 40.8 42.4 40.9 41.0  MCV 97.3 97.1 94.2 94.2 96.2  PLT  171 168 169 181 180   Basic Metabolic Panel:  Recent Labs Lab 10/16/15 1044 10/17/15 0452 10/18/15 0519 10/19/15 0453 10/20/15 0511  NA 136 143 138 138 138  K 4.4 3.4* 3.5 4.0 4.0  CL 108 113* 104 105 106  CO2 21* 25 26 26 24   GLUCOSE 349* 131* 131* 204* 188*  BUN 24* 13 10 17 20   CREATININE 1.73* 1.30* 1.33* 1.41* 1.42*  CALCIUM 9.2 9.1 9.4 9.2 9.2   Liver Function Tests:  Recent Labs Lab 10/16/15 1044 10/17/15 0452  AST 19 21  ALT 23 21  ALKPHOS 79 78  BILITOT 0.6 1.2  PROT 6.8 6.8  ALBUMIN 3.8 3.6    Recent Labs Lab 10/16/15 1108  AMMONIA 13   CBG:  Recent Labs Lab 10/19/15 2022 10/20/15 0021 10/20/15 0428 10/20/15 0806 10/20/15 1153  GLUCAP 162* 148* 213* 184* 131*   Sepsis Labs:  Recent Labs Lab 10/16/15 1049  LATICACIDVEN 1.78    Recent Results (from the past 240 hour(s))  Blood Culture (routine x 2)     Status: None (Preliminary result)   Collection Time: 10/16/15 10:30 AM  Result Value Ref Range Status   Specimen Description BLOOD BLOOD RIGHT FOREARM  Final   Special Requests BOTTLES DRAWN AEROBIC AND ANAEROBIC 5CC  Final   Culture   Final    NO GROWTH 1 DAY [facility]    Report Status PENDING  Incomplete  Urine culture     Status: None   Collection Time: 10/16/15 10:37 AM  Result Value Ref Range Status   Specimen Description URINE, RANDOM  Final   Special Requests NONE  Final   Culture NO GROWTH [facility]formed [facility]   Final   Report Status 10/17/2015 FINAL  Final  Blood Culture (routine x 2)     Status: None (Preliminary result)   Collection Time: 10/16/15 10:40 AM  Result Value Ref Range Status   Specimen Description BLOOD BLOOD LEFT FOREARM  Final   Special Requests BOTTLES DRAWN AEROBIC AND ANAEROBIC 5CC  Final   Culture  Setup Time   Final    GRAM POSITIVE COCCI IN CHAINS ANAEROBIC BOTTLE ONLY Organism ID to follow CRITICAL RESULT CALLED TO, READ BACK BY AND VERIFIED WITH: J LEGGE  10/17/15 @ 0921 M VESTAL Performed at [facility]    Culture GRAM POSITIVE COCCI  Final   Report Status PENDING  Incomplete  Blood Culture ID Panel (Reflexed)     Status: Abnormal   Collection Time: 10/16/15 10:40 AM  Result Value Ref Range Status   Enterococcus species NOT DETECTED NOT DETECTED Corrected   Listeria monocytogenes NOT DETECTED NOT DETECTED Corrected   Staphylococcus species NOT DETECTED NOT DETECTED Corrected   Staphylococcus aureus NOT DETECTED NOT DETECTED Corrected   Streptococcus species DETECTED (A) NOT DETECTED Corrected    Comment: CRITICAL RESULT CALLED TO, READ BACK BY AND VERIFIED WITH: J  LEGGE 10/17/15 @ 0921 M VESTAL    Streptococcus agalactiae NOT DETECTED NOT DETECTED Corrected   Streptococcus pneumoniae NOT DETECTED NOT DETECTED Corrected   Streptococcus pyogenes NOT DETECTED NOT DETECTED Corrected   Acinetobacter baumannii NOT DETECTED NOT DETECTED Corrected   Enterobacteriaceae species NOT DETECTED NOT DETECTED Corrected   Enterobacter cloacae complex NOT DETECTED NOT DETECTED Corrected   Escherichia coli NOT DETECTED NOT DETECTED Corrected   Klebsiella oxytoca NOT DETECTED NOT DETECTED Corrected   Klebsiella pneumoniae NOT DETECTED NOT DETECTED Corrected   Proteus species NOT DETECTED NOT DETECTED Corrected   Serratia marcescens NOT DETECTED NOT DETECTED Corrected   Haemophilus influenzae NOT DETECTED NOT DETECTED Corrected   Neisseria meningitidis NOT DETECTED NOT DETECTED Corrected   Pseudomonas aeruginosa NOT DETECTED NOT DETECTED Corrected   Candida albicans NOT DETECTED NOT DETECTED Corrected   Candida glabrata NOT DETECTED NOT DETECTED Corrected   Candida krusei NOT DETECTED NOT DETECTED Corrected   Candida parapsilosis NOT DETECTED NOT DETECTED Corrected   Candida tropicalis NOT DETECTED NOT DETECTED Corrected    Comment: Performed at [facility]  MRSA PCR Screening     Status: None   Collection Time: 10/16/15  1:50 PM  Result  Value Ref Range Status   MRSA by PCR NEGATIVE NEGATIVE Final     Radiology Studies: Ct Chest Wo Contrast  Result Date: 10/19/2015 CLINICAL DATA:  Metabolic encephalopathy with delirium. 82 yo male with moderate dementia, on antipsychotics at home. Presented with acute decompensation. Dyspnea. EXAM: CT CHEST WITHOUT CONTRAST TECHNIQUE: Multidetector CT imaging of the chest was performed following the standard protocol without IV contrast. COMPARISON:  10/17/2015 FINDINGS: Cardiovascular: Coronary, aortic arch, and branch vessel atherosclerotic vascular disease. Mediastinum/Nodes: Unremarkable Lungs/Pleura: Trace bilateral pleural effusions. Otherwise unremarkable Upper Abdomen: Cholecystectomy. Punctate calcification posteriorly in the right hepatic lobe on image 111/2 likely a focus of prior inflammation or remote granulomatous process. Along a non dependent part of the upper stomach body, there seems to be a focal wall thickening measuring approximately 1.7 by 2.8 by 3.0 cm, for example on image 102/2. This protrudes down into the lumen. Musculoskeletal: Subacute/healing fractures of the right eighth, ninth, and eleventh ribs; these ribs and adjacent ribs are incompletely visualized and accordingly there could be other fractures. One of these was seen on the rib images from 08/20/2015 and the degree of callus formation is consistent with injury at that time. IMPRESSION: 1. **An incidental finding of potential clinical significance has been found. 3 cm mass in the upper portion of the stomach body. Although possibly from food material, this is non dependent in location and I am suspicious of a true gastric mass. Consider upper endoscopy when feasible. ** 2. Trace bilateral pleural effusions. 3. Healing fractures of the right eighth, ninth, and eleventh ribs. 4. Coronary, aortic arch, and branch vessel atherosclerotic vascular disease. Electronically Signed   By: Gaylyn Rong M.D.   On: 10/19/2015 23:34     Scheduled Meds: . azithromycin  500 mg Oral Q24H  . enoxaparin (LOVENOX) injection  40 mg Subcutaneous Q24H  . fluticasone  1 spray Each Nare BID  . folic acid  1 mg Oral Daily  . insulin aspart  0-9 Units Subcutaneous Q4H  . LORazepam  0-4 mg Intravenous Q6H   Followed by  . [START ON 10/21/2015] LORazepam  0-4 mg Intravenous Q12H  . losartan  25 mg Oral q morning - 10a  . metoprolol  50 mg Oral BID  . multivitamin with minerals  1  tablet Oral Daily  . OLANZapine zydis  5 mg Oral QHS  . sodium chloride flush  3 mL Intravenous Q12H  . thiamine  100 mg Oral Daily   Or  . thiamine  100 mg Intravenous Daily  . traZODone  50 mg Oral QHS   Continuous Infusions:    LOS: 4 days   Iliza Blankenbeckler Vergie Living, MD [facility] Pager [phone]  If 7PM-7AM, please contact night-coverage [email or web address] Password [email or web address] 10/20/2015, 1:28 PM

## 2015-10-20 NOTE — Clinical Social Work Note (Signed)
MSW contacted patient's dtr, Judeth CornfieldStephanie in regards to [facility] requested. MSW also provided contact information for [employer] [facility] of [facility]. [facility] to [facility] for specific questions regarding additional sales rates and possible inclusive packages.   No further concerns reported at this time. MSW remains available as needed.   Derenda FennelBashira Anina Schnake, MSW [phone] 10/20/2015 10:03 AM

## 2015-10-20 NOTE — Progress Notes (Signed)
SLP Cancellation Note  Patient Details Name: Rocky CraftsJohn M Spruell MRN: [id number] DOB: 08/19/1930   Cancelled treatment:       Reason Eval/Treat Not Completed: Fatigue/lethargy limiting ability to participate.  Son present, has many concerns about his mother's ability to continue caring for his father.  Provided support and encouragement.  Will return next date for swallowing assessment.    Blenda MountsCouture, Lorette Peterkin Laurice 10/20/2015, 12:47 PM

## 2015-10-21 ENCOUNTER — Inpatient Hospital Stay (HOSPITAL_COMMUNITY): Payer: Medicare Other

## 2015-10-21 ENCOUNTER — Encounter (HOSPITAL_COMMUNITY): Payer: Self-pay | Admitting: Radiology

## 2015-10-21 LAB — GLUCOSE, CAPILLARY
GLUCOSE-CAPILLARY: 200 mg/dL — AB (ref 65–99)
GLUCOSE-CAPILLARY: 229 mg/dL — AB (ref 65–99)
GLUCOSE-CAPILLARY: 252 mg/dL — AB (ref 65–99)
GLUCOSE-CAPILLARY: 192 mg/dL — AB (ref 65–99)
Glucose-Capillary: 222 mg/dL — ABNORMAL HIGH (ref 65–99)

## 2015-10-21 LAB — CULTURE, BLOOD (ROUTINE X 2): CULTURE: NO GROWTH

## 2015-10-21 MED ORDER — METOPROLOL TARTRATE 5 MG/5ML IV SOLN
5.0000 mg | Freq: Four times a day (QID) | INTRAVENOUS | Status: DC
  Administered 2015-10-21 – 2015-10-24 (×12): 5 mg via INTRAVENOUS
  Filled 2015-10-21 (×12): qty 5

## 2015-10-21 MED ORDER — IOPAMIDOL (ISOVUE-300) INJECTION 61%: 80.0000 mL | Freq: Once | INTRAVENOUS | Status: DC | PRN

## 2015-10-21 MED ORDER — DEXTROSE 5 % IV SOLN
500.0000 mg | Freq: Every day | INTRAVENOUS | Status: DC
  Administered 2015-10-21 – 2015-10-23 (×3): 500 mg via INTRAVENOUS
  Filled 2015-10-21 (×4): qty 500

## 2015-10-21 MED ORDER — CLINDAMYCIN PHOSPHATE 600 MG/50ML IV SOLN
600.0000 mg | Freq: Three times a day (TID) | INTRAVENOUS | Status: DC
  Administered 2015-10-21 – 2015-10-24 (×9): 600 mg via INTRAVENOUS
  Filled 2015-10-21 (×9): qty 50

## 2015-10-21 MED ORDER — IOPAMIDOL (ISOVUE-300) INJECTION 61%
75.0000 mL | Freq: Once | INTRAVENOUS | Status: AC | PRN
  Administered 2015-10-21: 60 mL via INTRAVENOUS

## 2015-10-21 MED ORDER — SODIUM CHLORIDE 0.9 % IV BOLUS (SEPSIS)
500.0000 mL | Freq: Once | INTRAVENOUS | Status: AC
  Administered 2015-10-21: 500 mL via INTRAVENOUS

## 2015-10-21 MED ORDER — SODIUM CHLORIDE 0.9 % IV SOLN
INTRAVENOUS | Status: DC
  Administered 2015-10-21 – 2015-10-24 (×4): via INTRAVENOUS

## 2015-10-21 MED ORDER — LABETALOL HCL 5 MG/ML IV SOLN
10.0000 mg | INTRAVENOUS | Status: DC | PRN
  Filled 2015-10-21: qty 4

## 2015-10-21 NOTE — Evaluation (Signed)
Clinical/Bedside Swallow Evaluation Patient Details  Name: Charles Scott MRN: [id number] Date of Birth: 02/23/1930  Today's Date: 10/21/2015 Time: SLP Start Time (ACUTE ONLY): 0910 SLP Stop Time (ACUTE ONLY): 0940 SLP Time Calculation (min) (ACUTE ONLY): 30 min  Past Medical History:  Past Medical History:  Diagnosis Date  . Dementia   . Diabetes mellitus   . Gout   . Hypertension   . Vitamin B 12 deficiency    Past Surgical History:  Past Surgical History:  Procedure Laterality Date  . CATARACT EXTRACTION W/PHACO  11/18/2011   Procedure: CATARACT EXTRACTION PHACO AND INTRAOCULAR LENS PLACEMENT (IOC);  Surgeon: Susa Simmondsarroll F Haines, MD;  Location: [facility];  Service: Ophthalmology;  Laterality: Left;  CDE:74.35  . CATARACT EXTRACTION W/PHACO Right 03/30/2012   Procedure: CATARACT EXTRACTION PHACO AND INTRAOCULAR LENS PLACEMENT (IOC);  Surgeon: Susa Simmondsarroll F Haines, MD;  Location: [facility];  Service: Ophthalmology;  Laterality: Right;  CDE 74.16  . CHOLECYSTECTOMY  2000   [facility]   HPI:  Pt is an 82 y.o. male with PMH of dementia presenting to ED 9/4 with AMS. At baseline pt ambulates without assistance and has "good days and bad days". Head CT showed nothing acute, chest CT Showed trace bilateral pleural effusions along with possible 3 cm gastric mass in upper portion of stomach body. Bedside swallow eval ordered, pt was too lethargic past date to participate.    Assessment / Plan / Recommendation Clinical Impression  Pt currently with several high risk factors for aspiration, including waxing/ waning alertness, very dry mouth with dried skin/ secretions, some of which were removed given oral care with suction. Pt attempting to communicate but mostly incoherent. Pt did take several sips of thin liquid followed by a wet vocal quality. Swallowed puree consistency with verbal cues. Given continued inconsistent alertness levels and wet vocal quality, along with decreased management of secretions, recommend  that pt remain NPO; pt does appear safe to have meds crushed in puree if alert. Will continue to follow closely for PO readiness or readiness for objective evaluation. Pt with very dry mouth and needs frequent oral care to decrease risk of developing PNA.     Aspiration Risk  Severe aspiration risk    Diet Recommendation NPO   Medication Administration: Crushed with puree Postural Changes: Seated upright at 90 degrees (for meds)    Other  Recommendations Oral Care Recommendations: Oral care QID Other Recommendations: Have oral suction available   Follow up Recommendations  Skilled Nursing facility    Frequency and Duration min 2x/week  1 week       Prognosis Prognosis for Safe Diet Advancement: Fair Barriers to Reach Goals: Cognitive deficits      Swallow Study   General HPI: Pt is an 82 y.o. male with PMH of dementia presenting to ED 9/4 with AMS. At baseline pt ambulates without assistance and has "good days and bad days". Head CT showed nothing acute, chest CT Showed trace bilateral pleural effusions along with possible 3 cm gastric mass in upper portion of stomach body. Bedside swallow eval ordered, pt was too lethargic past date to participate.  Type of Study: Bedside Swallow Evaluation Diet Prior to this Study: Regular;Thin liquids Temperature Spikes Noted: Yes Respiratory Status: Room air History of Recent Intubation: No Behavior/Cognition: Alert;Cooperative;Confused;Requires cueing Oral Cavity Assessment: Dry;Dried secretions Oral Care Completed by SLP: Yes Oral Cavity - Dentition: Adequate natural dentition Vision: Impaired for self-feeding (would not open eyes) Self-Feeding Abilities: Total assist Patient Positioning: Upright in  bed Baseline Vocal Quality: Normal Volitional Cough: Weak    Oral/Motor/Sensory Function Overall Oral Motor/Sensory Function: Other (comment) (difficult to assess, no obvious motor deficits)   Ice Chips Ice chips: Not tested   Thin Liquid  Thin Liquid: Impaired Presentation: Straw Oral Phase Impairments: Poor awareness of bolus Pharyngeal  Phase Impairments: Wet Vocal Quality;Suspected delayed Swallow    Nectar Thick Nectar Thick Liquid: Not tested   Honey Thick Honey Thick Liquid: Not tested   Puree Puree: Impaired Presentation: Spoon Pharyngeal Phase Impairments: Wet Vocal Quality   Solid   GO   Solid: Not tested        Rebecca Eaton, Amy K, MA, CCC-SLP 10/21/2015,9:51 AM

## 2015-10-21 NOTE — Clinical Social Work Note (Signed)
Patient's wife, Scarlette CalicoFrances requesting to speak/meet with MD and or bedside RN to regards to prognosis and medical updates.   MSW paged MD with request and notified secretary to notify bedside RN.   No social work concerns reported at this time. MSW remains available as needed.   Derenda FennelBashira Iriana Artley, MSW [phone] 10/21/2015 12:30 PM

## 2015-10-21 NOTE — Progress Notes (Addendum)
PROGRESS NOTE    Charles Scott  ZOX:[id number] DOB: 01/31/1931 DOA: 10/16/2015 PCP: Fredirick Maudlin, MD   Brief Narrative:  Metabolic encephalopathy with delirium. 82 yo male with moderate dementia, on antipsychotics at home. Presented with acute decompensation, possibly from sinusitis vs advancing dementia.  Assessment & Plan: 1. Metabolic encephalopathy. Unclear etiology, ? Progressive dementia vs underlying infection. Patient still disorientated, but less agitated today, following some commands. Not sure that maxillary sinusitis really causing this. There was mention of ? PNA on admission so obtained CT chest to rule out aspiration PNA, only small effusions seen but incidental gastric mass seen on CT. Will continue regimen with pm olanzapine and trazodone, as needed lorazepam and haldol.  Neurology team has no further recommendations; may consider MRI of the brain to rule out CVA if he fails to improve, but he is moving all extremities so I don't think we need MRI at this time. Physical therapy evaluation done, ALF and supervision recommended. SW consulted for assistance. Pt will have to pay out of pocket and that will be a problem in terms of placement. Will also request SLP evaluation, which is pending his mental status.  Discussed with family that once he is stable will need EGD to evaluate possible gastric mass.  2. Maxillary sinusitis. Will continue azithromycin, fluticasone and saline nasal spray. Had fever this AM but missed dose of Abx yesterday as it was ordered PO. Changed to IV this AM.  3. HTN, essential. continue on metoprolol for now, hold losartan. OK to keep hydralazine as needed. Changed metoprolol to IV today scheduled and added PRN labetalol as well.   4. T2DM with complications of nephropathy. Will continue insulin sliding scale, continue to hold on oral hypoglycemic agents. Target serum glucose in the hospital 140 to 180 mg/dl.  5. CKD. Calculated GFR 37 c/w stage III.  Noted Na at 143, Cl 113, will change fluids to hypotonic saline at 75 cc/hr to prevent worsening hypernatremia and hyperchloremia. BMP in AM  Patient continue at moderate risk for worsening encephalopathy.   DVT prophylaxis: lovenox Code Status: DNR Family Communication: Once again spoke at length with patient son and his wife in room this AM  Disposition Plan: [facility] home as unable to pay for ALF/SNF  Consultants:   Neurology  Antimicrobials:   Azithromycin 9/4 -->  Subjective: Patient sleeping soundly this morning, so sitter. Arousable and following commands.  Objective: Vitals:   10/20/15 1235 10/20/15 2352 10/21/15 0409 10/21/15 0411  BP: 128/68 134/67 (!) 163/74   Pulse: 96 (!) 111 (!) 125   Resp: 16 20 20    Temp: 98 F (36.7 C) 99.8 F (37.7 C) (!) 101.4 F (38.6 C) 99.5 F (37.5 C)  TempSrc: Axillary Axillary Axillary Oral  SpO2: 98% 100% 99%   Weight:      Height:        Intake/Output Summary (Last 24 hours) at 10/21/15 0947 Last data filed at 10/20/15 2353  Gross per 24 hour  Intake                0 ml  Output             1075 ml  Net            -1075 ml   Filed Weights   10/16/15 1227 10/16/15 1310  Weight: 81.6 kg (180 lb) 77.1 kg (169 lb 15.6 oz)    Examination: General exam: sleeping and arousable ENT: mild conjunctival pallor, no icterus, oral mucosa  moist. Respiratory system: Respiratory effort normal. Mild decreased breath sounds at bases, no wheezing, Cardiovascular system: S1 & S2 heard, tachy and regular. No JVD, murmurs, rubs, gallops or clicks. No pedal edema. Gastrointestinal system: Abdomen is nondistended, soft and nontender. No organomegaly or masses felt. Normal bowel sounds heard. Central nervous system: Somnolent this AM, moving all 4 extremities spont but has been in two point restraints. Wakes up, talks to me but thinks he is at home.   Data Reviewed: I have personally reviewed following labs and imaging  studies  CBC:  Recent Labs Lab 10/16/15 1044 10/17/15 0452 10/18/15 0519 10/19/15 0453 10/20/15 0511  WBC 8.0 7.2 7.7 8.1 8.2  NEUTROABS 6.0  --  5.3  --   --   HGB 13.5 13.9 15.1 14.3 13.9  HCT 40.1 40.8 42.4 40.9 41.0  MCV 97.3 97.1 94.2 94.2 96.2  PLT 171 168 169 181 180   Basic Metabolic Panel:  Recent Labs Lab 10/16/15 1044 10/17/15 0452 10/18/15 0519 10/19/15 0453 10/20/15 0511  NA 136 143 138 138 138  K 4.4 3.4* 3.5 4.0 4.0  CL 108 113* 104 105 106  CO2 21* 25 26 26 24   GLUCOSE 349* 131* 131* 204* 188*  BUN 24* 13 10 17 20   CREATININE 1.73* 1.30* 1.33* 1.41* 1.42*  CALCIUM 9.2 9.1 9.4 9.2 9.2   Liver Function Tests:  Recent Labs Lab 10/16/15 1044 10/17/15 0452  AST 19 21  ALT 23 21  ALKPHOS 79 78  BILITOT 0.6 1.2  PROT 6.8 6.8  ALBUMIN 3.8 3.6    Recent Labs Lab 10/16/15 1108  AMMONIA 13   CBG:  Recent Labs Lab 10/20/15 1718 10/20/15 1955 10/20/15 2350 10/21/15 0408 10/21/15 0746  GLUCAP 170* 176* 138* 200* 229*   Sepsis Labs:  Recent Labs Lab 10/16/15 1049  LATICACIDVEN 1.78    Recent Results (from the past 240 hour(s))  Blood Culture (routine x 2)     Status: None (Preliminary result)   Collection Time: 10/16/15 10:30 AM  Result Value Ref Range Status   Specimen Description BLOOD BLOOD RIGHT FOREARM  Final   Special Requests BOTTLES DRAWN AEROBIC AND ANAEROBIC 5CC  Final   Culture   Final    NO GROWTH 1 DAY Performed at [facility]    Report Status PENDING  Incomplete  Urine culture     Status: None   Collection Time: 10/16/15 10:37 AM  Result Value Ref Range Status   Specimen Description URINE, RANDOM  Final   Special Requests NONE  Final   Culture NO GROWTH Performed at [facility]   Final   Report Status 10/17/2015 FINAL  Final  Blood Culture (routine x 2)     Status: None (Preliminary result)   Collection Time: 10/16/15 10:40 AM  Result Value Ref Range Status   Specimen Description BLOOD  BLOOD LEFT FOREARM  Final   Special Requests BOTTLES DRAWN AEROBIC AND ANAEROBIC 5CC  Final   Culture  Setup Time   Final    GRAM POSITIVE COCCI IN CHAINS ANAEROBIC BOTTLE ONLY Organism ID to follow CRITICAL RESULT CALLED TO, READ BACK BY AND VERIFIED WITH: J LEGGE 10/17/15 @ 0921 M VESTAL Performed at [facility]     [facility]  Final   Report Status PENDING  Incomplete  Blood Culture ID Panel (Reflexed)     Status: Abnormal   Collection Time: 10/16/15 10:40 AM  Result Value Ref Range Status   Enterococcus  species NOT DETECTED NOT DETECTED Corrected   Listeria monocytogenes NOT DETECTED NOT DETECTED Corrected   Staphylococcus species NOT DETECTED NOT DETECTED Corrected   Staphylococcus aureus NOT DETECTED NOT DETECTED Corrected   Streptococcus species DETECTED (A) NOT DETECTED Corrected    Comment: CRITICAL RESULT CALLED TO, READ BACK BY AND VERIFIED WITH: J LEGGE 10/17/15 @ 0921 M VESTAL    Streptococcus agalactiae NOT DETECTED NOT DETECTED Corrected   Streptococcus pneumoniae NOT DETECTED NOT DETECTED Corrected   Streptococcus pyogenes NOT DETECTED NOT DETECTED Corrected   Acinetobacter baumannii NOT DETECTED NOT DETECTED Corrected   Enterobacteriaceae species NOT DETECTED NOT DETECTED Corrected   Enterobacter cloacae complex NOT DETECTED NOT DETECTED Corrected   Escherichia coli NOT DETECTED NOT DETECTED Corrected   Klebsiella oxytoca NOT DETECTED NOT DETECTED Corrected   Klebsiella pneumoniae NOT DETECTED NOT DETECTED Corrected   Proteus species NOT DETECTED NOT DETECTED Corrected   Serratia marcescens NOT DETECTED NOT DETECTED Corrected   Haemophilus influenzae NOT DETECTED NOT DETECTED Corrected   Neisseria meningitidis NOT DETECTED NOT DETECTED Corrected   Pseudomonas aeruginosa NOT DETECTED NOT DETECTED Corrected   Candida albicans NOT DETECTED NOT DETECTED Corrected   Candida glabrata NOT DETECTED NOT DETECTED Corrected   Candida krusei NOT DETECTED  NOT DETECTED Corrected   Candida parapsilosis NOT DETECTED NOT DETECTED Corrected   Candida tropicalis NOT DETECTED NOT DETECTED Corrected    Comment: Performed at [facility]  MRSA PCR Screening     Status: None   Collection Time: 10/16/15  1:50 PM  Result Value Ref Range Status   MRSA by PCR NEGATIVE NEGATIVE Final     Radiology Studies: Ct Chest Wo Contrast  Result Date: 10/19/2015 CLINICAL DATA:  Metabolic encephalopathy with delirium. 82 yo male with moderate dementia, on antipsychotics at home. Presented with acute decompensation. Dyspnea. EXAM: CT CHEST WITHOUT CONTRAST TECHNIQUE: Multidetector CT imaging of the chest was performed following the standard protocol without IV contrast. COMPARISON:  10/17/2015 FINDINGS: Cardiovascular: Coronary, aortic arch, and branch vessel atherosclerotic vascular disease. Mediastinum/Nodes: Unremarkable Lungs/Pleura: Trace bilateral pleural effusions. Otherwise unremarkable Upper Abdomen: Cholecystectomy. Punctate calcification posteriorly in the right hepatic lobe on image 111/2 likely a focus of prior inflammation or remote granulomatous process. Along a non dependent part of the upper stomach body, there seems to be a focal wall thickening measuring approximately 1.7 by 2.8 by 3.0 cm, for example on image 102/2. This protrudes down into the lumen. Musculoskeletal: Subacute/healing fractures of the right eighth, ninth, and eleventh ribs; these ribs and adjacent ribs are incompletely visualized and accordingly there could be other fractures. One of these was seen on the rib images from 08/20/2015 and the degree of callus formation is consistent with injury at that time. IMPRESSION: 1. **An incidental finding of potential clinical significance has been found. 3 cm mass in the upper portion of the stomach body. Although possibly from food material, this is non dependent in location and I am suspicious of a true gastric mass. Consider upper endoscopy when  feasible. ** 2. Trace bilateral pleural effusions. 3. Healing fractures of the right eighth, ninth, and eleventh ribs. 4. Coronary, aortic arch, and branch vessel atherosclerotic vascular disease. Electronically Signed   By: Gaylyn Rong M.D.   On: 10/19/2015 23:34   Scheduled Meds: . azithromycin  500 mg Intravenous Daily  . enoxaparin (LOVENOX) injection  40 mg Subcutaneous Q24H  . fluticasone  1 spray Each Nare BID  . folic acid  1 mg Oral Daily  .  insulin aspart  0-9 Units Subcutaneous Q4H  . LORazepam  0-4 mg Intravenous Q6H   Followed by  . LORazepam  0-4 mg Intravenous Q12H  . losartan  25 mg Oral q morning - 10a  . metoprolol  5 mg Intravenous Q6H  . multivitamin with minerals  1 tablet Oral Daily  . OLANZapine zydis  5 mg Oral QHS  . sodium chloride flush  3 mL Intravenous Q12H  . thiamine  100 mg Oral Daily   Or  . thiamine  100 mg Intravenous Daily  . traZODone  50 mg Oral QHS   Continuous Infusions:    LOS: 5 days   Mir Vergie Living, MD [facility] Pager [phone]  If 7PM-7AM, please contact night-coverage [email or web address] Password [email or web address] 10/21/2015, 9:47 AM

## 2015-10-22 LAB — GLUCOSE, CAPILLARY
GLUCOSE-CAPILLARY: 159 mg/dL — AB (ref 65–99)
GLUCOSE-CAPILLARY: 161 mg/dL — AB (ref 65–99)
GLUCOSE-CAPILLARY: 171 mg/dL — AB (ref 65–99)
GLUCOSE-CAPILLARY: 176 mg/dL — AB (ref 65–99)
GLUCOSE-CAPILLARY: 250 mg/dL — AB (ref 65–99)
Glucose-Capillary: 170 mg/dL — ABNORMAL HIGH (ref 65–99)
Glucose-Capillary: 130 mg/dL — ABNORMAL HIGH (ref 65–99)

## 2015-10-22 LAB — BASIC METABOLIC PANEL
Anion gap: 7 (ref 5–15)
BUN: 28 mg/dL — AB (ref 6–20)
CHLORIDE: 108 mmol/L (ref 101–111)
CO2: 24 mmol/L (ref 22–32)
CREATININE: 1.51 mg/dL — AB (ref 0.61–1.24)
Calcium: 8.7 mg/dL — ABNORMAL LOW (ref 8.9–10.3)
GFR calc Af Amer: 48 mL/min — ABNORMAL LOW (ref >60)
GFR calc non Af Amer: 41 mL/min — ABNORMAL LOW (ref >60)
Glucose, Bld: 191 mg/dL — ABNORMAL HIGH (ref 65–99)
Potassium: 3.9 mmol/L (ref 3.5–5.1)
SODIUM: 139 mmol/L (ref 135–145)

## 2015-10-22 LAB — CBC
HCT: 39.1 % (ref 39.0–52.0)
Hemoglobin: 13.5 g/dL (ref 13.0–17.0)
MCH: 33.3 pg (ref 26.0–34.0)
MCHC: 34.5 g/dL (ref 30.0–36.0)
MCV: 96.5 fL (ref 78.0–100.0)
PLATELETS: 183 10*3/uL (ref 150–400)
RBC: 4.05 MIL/uL — ABNORMAL LOW (ref 4.22–5.81)
RDW: 14.1 % (ref 11.5–15.5)
WBC: 10 10*3/uL (ref 4.0–10.5)

## 2015-10-22 LAB — CULTURE, BLOOD (ROUTINE X 2)
Culture: NO GROWTH
Culture: NO GROWTH

## 2015-10-22 NOTE — Progress Notes (Addendum)
PROGRESS NOTE    Charles Scott  ONG:[id number] DOB: May 03, 1930 DOA: 10/16/2015 PCP: Fredirick Maudlin, MD   Brief Narrative:  Metabolic encephalopathy with delirium. 82 yo male with moderate dementia, on antipsychotics at home. Presented with acute decompensation, possibly from sinusitis vs advancing dementia. Also found to have right upper tooth abscess on 9/9 and started on IV clindamycin, discussed with ENT and recommend several days of IV clindamycin with possible dental consult if not remaining afebrile.  Assessment & Plan: 1. Metabolic encephalopathy. Unclear etiology, ? Progressive dementia vs underlying infection. Patient still disorientated, but less agitated today, following some commands. Found to have tooth abscess 9/10 and was started on IV clindamycin on 9/9. There was mention of ? PNA on admission so obtained CT chest to rule out aspiration PNA, only small effusions seen but incidental gastric mass seen on CT. Will continue regimen with pm olanzapine and trazodone, as needed lorazepam and haldol.  Neurology team has no further recommendations, do not suspect CVA. Physical therapy evaluation done, ALF and supervision recommended. SW consulted for assistance. Pt will have to pay out of pocket and that will be a problem in terms of placement. Will also request SLP evaluation, which is pending his mental status.  Discussed with family that once he is stable will need EGD to evaluate possible gastric mass.  2. Maxillary sinusitis. Will continue azithromycin, fluticasone and saline nasal spray. Had fever this AM but missed dose of Abx yesterday as it was ordered PO. Changed to IV 9/9.  Ascess tooth as mentioned above added IV clinda on 9/9, discussed with ENT who recommend several days of this appropriate antibiotic. If remains febrile will need tooth extraction with dentist.  3. HTN, essential. continue on metoprolol for now, hold losartan. OK to keep hydralazine as needed. Changed  metoprolol to IV today scheduled and added PRN labetalol as well.   4. T2DM with complications of nephropathy. Will continue insulin sliding scale, continue to hold on oral hypoglycemic agents. Target serum glucose in the hospital 140 to 180 mg/dl.  5. CKD. Calculated GFR 37 c/w stage III. Noted Na at 143, Cl 113, will change fluids to hypotonic saline at 75 cc/hr to prevent worsening hypernatremia and hyperchloremia. BMP in AM  Patient continue at moderate risk for worsening encephalopathy.   DVT prophylaxis: lovenox Code Status: DNR  Family Communication: Family not in room this AM, message left on son's VM with updates.  Disposition Plan: [facility] home as unable to pay for ALF/SNF  Consultants:   Neurology  Antimicrobials:   Azithromycin 9/4 -->  Clinda 9/9 -->  Subjective: Patient sleeping, not really arousable, does not wake up to voice. He took a swipe at me though.  Objective: Vitals:   10/21/15 2018 10/22/15 0000 10/22/15 0500 10/22/15 0722  BP: (!) 159/87 (!) 147/89 (!) 163/80   Pulse: (!) 108  (!) 108   Resp: 16  18   Temp: 100.3 F (37.9 C)  (!) 101 F (38.3 C) 99.8 F (37.7 C)  TempSrc: Axillary  Axillary Axillary  SpO2: 97%  100%   Weight:      Height:        Intake/Output Summary (Last 24 hours) at 10/22/15 1148 Last data filed at 10/22/15 0600  Gross per 24 hour  Intake           1202.5 ml  Output              275 ml  Net  927.5 ml   Filed Weights   10/16/15 1227 10/16/15 1310  Weight: 81.6 kg (180 lb) 77.1 kg (169 lb 15.6 oz)    Examination: General exam: sleeping and arousable ENT: mild conjunctival pallor, no icterus, oral mucosa moist. Respiratory system: Respiratory effort normal. Mild decreased breath sounds at bases, no wheezing, Cardiovascular system: S1 & S2 heard, tachy and regular. No JVD, murmurs, rubs, gallops or clicks. No pedal edema. Gastrointestinal system: Abdomen is nondistended, soft and nontender. No organomegaly  or masses felt. Normal bowel sounds heard. Central nervous system: Somnolent this AM, moving all 4 extremities spont but has been in two point restraints. Wakes up, talks to me but thinks he is at home.   Data Reviewed: I have personally reviewed following labs and imaging studies  CBC:  Recent Labs Lab 10/16/15 1044 10/17/15 0452 10/18/15 0519 10/19/15 0453 10/20/15 0511 10/22/15 0732  WBC 8.0 7.2 7.7 8.1 8.2 10.0  NEUTROABS 6.0  --  5.3  --   --   --   HGB 13.5 13.9 15.1 14.3 13.9 13.5  HCT 40.1 40.8 42.4 40.9 41.0 39.1  MCV 97.3 97.1 94.2 94.2 96.2 96.5  PLT 171 168 169 181 180 183   Basic Metabolic Panel:  Recent Labs Lab 10/17/15 0452 10/18/15 0519 10/19/15 0453 10/20/15 0511 10/22/15 0732  NA 143 138 138 138 139  K 3.4* 3.5 4.0 4.0 3.9  CL 113* 104 105 106 108  CO2 25 26 26 24 24   GLUCOSE 131* 131* 204* 188* 191*  BUN 13 10 17 20  28*  CREATININE 1.30* 1.33* 1.41* 1.42* 1.51*  CALCIUM 9.1 9.4 9.2 9.2 8.7*   Liver Function Tests:  Recent Labs Lab 10/16/15 1044 10/17/15 0452  AST 19 21  ALT 23 21  ALKPHOS 79 78  BILITOT 0.6 1.2  PROT 6.8 6.8  ALBUMIN 3.8 3.6    Recent Labs Lab 10/16/15 1108  AMMONIA 13   CBG:  Recent Labs Lab 10/21/15 2015 10/22/15 0028 10/22/15 0424 10/22/15 0456 10/22/15 0826  GLUCAP 192* 159* 170* 176* 171*   Sepsis Labs:  Recent Labs Lab 10/16/15 1049  LATICACIDVEN 1.78    Recent Results (from the past 240 hour(s))  Blood Culture (routine x 2)     Status: None (Preliminary result)   Collection Time: 10/16/15 10:30 AM  Result Value Ref Range Status   Specimen Description BLOOD BLOOD RIGHT FOREARM  Final   Special Requests BOTTLES DRAWN AEROBIC AND ANAEROBIC 5CC  Final   Culture   Final    NO GROWTH 1 DAY Performed at [facility]    Report Status PENDING  Incomplete  Urine culture     Status: None   Collection Time: 10/16/15 10:37 AM  Result Value Ref Range Status   Specimen Description URINE,  RANDOM  Final   Special Requests NONE  Final   Culture NO GROWTH Performed at [facility]   Final   Report Status 10/17/2015 FINAL  Final  Blood Culture (routine x 2)     Status: None (Preliminary result)   Collection Time: 10/16/15 10:40 AM  Result Value Ref Range Status   Specimen Description BLOOD BLOOD LEFT FOREARM  Final   Special Requests BOTTLES DRAWN AEROBIC AND ANAEROBIC 5CC  Final   Culture  Setup Time   Final    GRAM POSITIVE COCCI IN CHAINS ANAEROBIC BOTTLE ONLY Organism ID to follow CRITICAL RESULT CALLED TO, READ BACK BY AND VERIFIED WITH: J LEGGE 10/17/15 @ 1610  M VESTAL Performed at [facility]    Culture [facility]  Final   Report Status PENDING  Incomplete  Blood Culture ID Panel (Reflexed)     Status: Abnormal   Collection Time: 10/16/15 10:40 AM  Result Value Ref Range Status   Enterococcus species NOT DETECTED NOT DETECTED Corrected   Listeria monocytogenes NOT DETECTED NOT DETECTED Corrected   Staphylococcus species NOT DETECTED NOT DETECTED Corrected   Staphylococcus aureus NOT DETECTED NOT DETECTED Corrected   Streptococcus species DETECTED (A) NOT DETECTED Corrected    Comment: CRITICAL RESULT CALLED TO, READ BACK BY AND VERIFIED WITH: J LEGGE 10/17/15 @ 0921 M VESTAL    Streptococcus agalactiae NOT DETECTED NOT DETECTED Corrected   Streptococcus pneumoniae NOT DETECTED NOT DETECTED Corrected   Streptococcus pyogenes NOT DETECTED NOT DETECTED Corrected   Acinetobacter baumannii NOT DETECTED NOT DETECTED Corrected   Enterobacteriaceae species NOT DETECTED NOT DETECTED Corrected   Enterobacter cloacae complex NOT DETECTED NOT DETECTED Corrected   Escherichia coli NOT DETECTED NOT DETECTED Corrected   Klebsiella oxytoca NOT DETECTED NOT DETECTED Corrected   Klebsiella pneumoniae NOT DETECTED NOT DETECTED Corrected   Proteus species NOT DETECTED NOT DETECTED Corrected   Serratia marcescens NOT DETECTED NOT DETECTED Corrected    Haemophilus influenzae NOT DETECTED NOT DETECTED Corrected   Neisseria meningitidis NOT DETECTED NOT DETECTED Corrected   Pseudomonas aeruginosa NOT DETECTED NOT DETECTED Corrected   Candida albicans NOT DETECTED NOT DETECTED Corrected   Candida glabrata NOT DETECTED NOT DETECTED Corrected   Candida krusei NOT DETECTED NOT DETECTED Corrected   Candida parapsilosis NOT DETECTED NOT DETECTED Corrected   Candida tropicalis NOT DETECTED NOT DETECTED Corrected    Comment: Performed at [facility]  MRSA PCR Screening     Status: None   Collection Time: 10/16/15  1:50 PM  Result Value Ref Range Status   MRSA by PCR NEGATIVE NEGATIVE Final     Radiology Studies: Ct Maxillofacial W Contrast  Result Date: 10/21/2015 CLINICAL DATA:  Evaluate for tooth abscess. EXAM: CT MAXILLOFACIAL WITH CONTRAST TECHNIQUE: Multidetector CT imaging of the maxillofacial structures was performed with intravenous contrast. Multiplanar CT image reconstructions were also generated. A small metallic BB was placed on the right temple in order to reliably differentiate right from left. CONTRAST:  60mL ISOVUE-300 IOPAMIDOL (ISOVUE-300) INJECTION 61% COMPARISON:  10/16/2015 FINDINGS: There is moderate asymmetric mucosal thickening involving the right maxillary sinus. Periapical abscess is identified involving the right upper second molar. There is erosion of the abscess through the floor of the right maxillary sinus, image 27 of series 602. The visualized intracranial contents are unremarkable. No drainable soft tissue abscess identified. The orbits appear normal. IMPRESSION: 1. Right upper periapical abscess with associated right maxillary sinus inflammation. Electronically Signed   By: Signa Kell M.D.   On: 10/21/2015 18:26   Scheduled Meds: . azithromycin  500 mg Intravenous Daily  . clindamycin (CLEOCIN) IV  600 mg Intravenous Q8H  . enoxaparin (LOVENOX) injection  40 mg Subcutaneous Q24H  . fluticasone  1 spray  Each Nare BID  . folic acid  1 mg Oral Daily  . insulin aspart  0-9 Units Subcutaneous Q4H  . LORazepam  0-4 mg Intravenous Q12H  . losartan  25 mg Oral q morning - 10a  . metoprolol  5 mg Intravenous Q6H  . multivitamin with minerals  1 tablet Oral Daily  . OLANZapine zydis  5 mg Oral QHS  . sodium chloride flush  3 mL  Intravenous Q12H  . thiamine  100 mg Oral Daily   Or  . thiamine  100 mg Intravenous Daily  . traZODone  50 mg Oral QHS   Continuous Infusions: . sodium chloride 50 mL/hr at 10/21/15 2124    LOS: 6 days   Mir Vergie LivingMohammed Ikramullah, MD [facility] Pager [phone]  If 7PM-7AM, please contact night-coverage [email or web address] Password [email or web address] 10/22/2015, 11:48 AM

## 2015-10-23 DIAGNOSIS — G9340 Encephalopathy, unspecified: Secondary | ICD-10-CM

## 2015-10-23 DIAGNOSIS — F0391 Unspecified dementia with behavioral disturbance: Secondary | ICD-10-CM

## 2015-10-23 DIAGNOSIS — D72829 Elevated white blood cell count, unspecified: Secondary | ICD-10-CM

## 2015-10-23 DIAGNOSIS — N183 Chronic kidney disease, stage 3 (moderate): Secondary | ICD-10-CM

## 2015-10-23 DIAGNOSIS — K047 Periapical abscess without sinus: Secondary | ICD-10-CM

## 2015-10-23 DIAGNOSIS — Z515 Encounter for palliative care: Secondary | ICD-10-CM

## 2015-10-23 DIAGNOSIS — Z7189 Other specified counseling: Secondary | ICD-10-CM

## 2015-10-23 LAB — GLUCOSE, CAPILLARY
GLUCOSE-CAPILLARY: 143 mg/dL — AB (ref 65–99)
GLUCOSE-CAPILLARY: 145 mg/dL — AB (ref 65–99)
GLUCOSE-CAPILLARY: 145 mg/dL — AB (ref 65–99)
Glucose-Capillary: 153 mg/dL — ABNORMAL HIGH (ref 65–99)
Glucose-Capillary: 176 mg/dL — ABNORMAL HIGH (ref 65–99)
Glucose-Capillary: 187 mg/dL — ABNORMAL HIGH (ref 65–99)
Glucose-Capillary: 197 mg/dL — ABNORMAL HIGH (ref 65–99)

## 2015-10-23 LAB — CBC
HEMATOCRIT: 43.3 % (ref 39.0–52.0)
HEMOGLOBIN: 14.8 g/dL (ref 13.0–17.0)
MCH: 33.1 pg (ref 26.0–34.0)
MCHC: 34.2 g/dL (ref 30.0–36.0)
MCV: 96.9 fL (ref 78.0–100.0)
PLATELETS: 230 10*3/uL (ref 150–400)
RBC: 4.47 MIL/uL (ref 4.22–5.81)
RDW: 13.9 % (ref 11.5–15.5)
WBC: 10.8 10*3/uL — ABNORMAL HIGH (ref 4.0–10.5)

## 2015-10-23 LAB — BASIC METABOLIC PANEL
ANION GAP: 11 (ref 5–15)
BUN: 27 mg/dL — ABNORMAL HIGH (ref 6–20)
CO2: 25 mmol/L (ref 22–32)
Calcium: 9.1 mg/dL (ref 8.9–10.3)
Chloride: 104 mmol/L (ref 101–111)
Creatinine, Ser: 1.34 mg/dL — ABNORMAL HIGH (ref 0.61–1.24)
GFR calc Af Amer: 55 mL/min — ABNORMAL LOW (ref >60)
GFR, EST NON AFRICAN AMERICAN: 48 mL/min — AB (ref >60)
GLUCOSE: 157 mg/dL — AB (ref 65–99)
POTASSIUM: 4.2 mmol/L (ref 3.5–5.1)
Sodium: 140 mmol/L (ref 135–145)

## 2015-10-23 NOTE — Progress Notes (Signed)
Patient ID: Charles Scott Knab, male   DOB: 12/03/1930, 82 y.o.   MRN: [id number]  PROGRESS NOTE    Charles Scott Ausley  WUJ:[id number] DOB: 08/25/1930 DOA: 10/16/2015  PCP: Fredirick MaudlinHAWKINS,EDWARD L, MD   Brief Narrative:  [age] year old male with past medical history of dementia, on antipsychotics at home. Patient presented with worsening mental status. He was found to have a right upper tooth abscess on the admission and started on IV clindamycin. This was discussed with ENT who recommended IV clindamycin and if patient continues to spike fever to have official dental consult. Patient has not spiked fever since. Barrier to discharge: Ongoing altered mental status. Palliative care consulted for goals of care.   Assessment & Plan:  Acute metabolic encephalopathy / dementia with behavioral disturbance - Likely worsening dementia - Patient has mittens on as he was pulling on IV lines - He is still disoriented but less agitated - Seen by neurology in consultation, no further recommendations other than continuing olanzapine, trazodone, lorazepam and Haldol as needed - Palliative consulted for goals of care  Incidental gastric mass on CT  - We will get palliative care for goals of care  Maxillary sinusitis - Will continue azithromycin, fluticasone and saline nasal spray.  Tooth abscess / Leukocytosis  - Continue clindamycin  HTN, essential - Continue losartan and metoprolol  Type 2 diabetes mellitus with diabetic nephropathy on chronic current insulin use - Continue sliding scale insulin   Chronic kidney disease stage III - Creatinine stable at 1.34    DVT prophylaxis: Lovenox subcutaneous Code Status: DNR/DNI  Family Communication: Family not at the bedside this morning Disposition Plan: Unclear as to when he will be discharged, appreciate palliative care for goals of care   Consultants:   Neurology  Palliative care  Procedures:   None   Antimicrobials:   Azithromycin 9/4  -->  Clinda 9/9 -->   Subjective: No overnight events.   Objective: Vitals:   10/22/15 0722 10/22/15 1232 10/22/15 2042 10/23/15 0143  BP:  (!) 148/74 (!) 143/65 (!) 164/84  Pulse:  97 96 (!) 109  Resp:  20 20   Temp: 99.8 F (37.7 C) 99 F (37.2 C) 99.2 F (37.3 C)   TempSrc: Axillary Axillary Axillary   SpO2:  100% 100%   Weight:      Height:        Intake/Output Summary (Last 24 hours) at 10/23/15 1203 Last data filed at 10/22/15 2300  Gross per 24 hour  Intake             1150 ml  Output              550 ml  Net              600 ml   Filed Weights   10/16/15 1227 10/16/15 1310  Weight: 81.6 kg (180 lb) 77.1 kg (169 lb 15.6 oz)    Examination:  General exam: Appears calm and comfortable; has mittens on Respiratory system: Clear to auscultation. Respiratory effort normal. Cardiovascular system: S1 & S2 heard, RRR. No pedal edema. Gastrointestinal system: Abdomen is nondistended, soft and nontender. No organomegaly or masses felt. Normal bowel sounds heard. Central nervous system: No focal neurological deficits. Extremities: No edema, palpable pulses  Skin: No rashes, lesions or ulcers Psychiatry: Sleeping, mumbles incoherently when asked how he is doing, he is unable to hold conversation   Data Reviewed: I have personally reviewed following labs and imaging studies  CBC:  Recent Labs Lab  10/18/15 0519 10/19/15 0453 10/20/15 0511 10/22/15 0732 10/23/15 0328  WBC 7.7 8.1 8.2 10.0 10.8*  NEUTROABS 5.3  --   --   --   --   HGB 15.1 14.3 13.9 13.5 14.8  HCT 42.4 40.9 41.0 39.1 43.3  MCV 94.2 94.2 96.2 96.5 96.9  PLT 169 181 180 183 230   Basic Metabolic Panel:  Recent Labs Lab 10/18/15 0519 10/19/15 0453 10/20/15 0511 10/22/15 0732 10/23/15 0328  NA 138 138 138 139 140  K 3.5 4.0 4.0 3.9 4.2  CL 104 105 106 108 104  CO2 26 26 24 24 25   GLUCOSE 131* 204* 188* 191* 157*  BUN 10 17 20  28* 27*  CREATININE 1.33* 1.41* 1.42* 1.51* 1.34*  CALCIUM  9.4 9.2 9.2 8.7* 9.1   GFR: Estimated Creatinine Clearance: 39.7 mL/min (by C-G formula based on SCr of 1.34 mg/dL). Liver Function Tests:  Recent Labs Lab 10/17/15 0452  AST 21  ALT 21  ALKPHOS 78  BILITOT 1.2  PROT 6.8  ALBUMIN 3.6   No results for input(s): LIPASE, AMYLASE in the last 168 hours. No results for input(s): AMMONIA in the last 168 hours. Coagulation Profile: No results for input(s): INR, PROTIME in the last 168 hours. Cardiac Enzymes: No results for input(s): CKTOTAL, CKMB, CKMBINDEX, TROPONINI in the last 168 hours. BNP (last 3 results) No results for input(s): PROBNP in the last 8760 hours. HbA1C: No results for input(s): HGBA1C in the last 72 hours. CBG:  Recent Labs Lab 10/22/15 1652 10/22/15 2035 10/23/15 0248 10/23/15 0430 10/23/15 0828  GLUCAP 161* 130* 143* 145* 153*   Lipid Profile: No results for input(s): CHOL, HDL, LDLCALC, TRIG, CHOLHDL, LDLDIRECT in the last 72 hours. Thyroid Function Tests: No results for input(s): TSH, T4TOTAL, FREET4, T3FREE, THYROIDAB in the last 72 hours. Anemia Panel: No results for input(s): VITAMINB12, FOLATE, FERRITIN, TIBC, IRON, RETICCTPCT in the last 72 hours. Urine analysis:    Component Value Date/Time   COLORURINE YELLOW 10/16/2015 1037   APPEARANCEUR CLEAR 10/16/2015 1037   LABSPEC 1.016 10/16/2015 1037   PHURINE 5.0 10/16/2015 1037   GLUCOSEU 500 (A) 10/16/2015 1037   HGBUR NEGATIVE 10/16/2015 1037   BILIRUBINUR NEGATIVE 10/16/2015 1037   KETONESUR NEGATIVE 10/16/2015 1037   PROTEINUR NEGATIVE 10/16/2015 1037   NITRITE NEGATIVE 10/16/2015 1037   LEUKOCYTESUR NEGATIVE 10/16/2015 1037   Sepsis Labs: @LABRCNTIP (procalcitonin:4,lacticidven:4)   Blood Culture (routine x 2)     Status: None   Collection Time: 10/16/15 10:30 AM  Result Value Ref Range Status   Specimen Description BLOOD BLOOD RIGHT FOREARM  Final   Special Requests BOTTLES DRAWN AEROBIC AND ANAEROBIC 5CC  Final   Culture    Final    NO GROWTH 5 DAYS    Report Status 10/21/2015 FINAL  Final  Urine culture     Status: None   Collection Time: 10/16/15 10:37 AM  Result Value Ref Range Status   Specimen Description URINE, RANDOM  Final   Special Requests NONE  Final   Culture NO GROWTH   Final   Report Status 10/17/2015 FINAL  Final  Blood Culture (routine x 2)     Status: Abnormal   Collection Time: 10/16/15 10:40 AM  Result Value Ref Range Status   Specimen Description BLOOD BLOOD LEFT FOREARM  Final   Special Requests BOTTLES DRAWN AEROBIC AND ANAEROBIC 5CC  Final   Culture  Setup Time   Final   Culture (A)  Final  VIRIDANS STREPTOCOCCUS    Report Status 10/19/2015 FINAL  Final  Blood Culture ID Panel (Reflexed)     Status: Abnormal   Collection Time: 10/16/15 10:40 AM  Result Value Ref Range Status   Enterococcus species NOT DETECTED NOT DETECTED Corrected   Listeria monocytogenes NOT DETECTED NOT DETECTED Corrected   Staphylococcus species NOT DETECTED NOT DETECTED Corrected   Staphylococcus aureus NOT DETECTED NOT DETECTED Corrected   Streptococcus species DETECTED (A) NOT DETECTED Corrected   Streptococcus agalactiae NOT DETECTED NOT DETECTED Corrected   Streptococcus pneumoniae NOT DETECTED NOT DETECTED Corrected   Streptococcus pyogenes NOT DETECTED NOT DETECTED Corrected   Acinetobacter baumannii NOT DETECTED NOT DETECTED Corrected   Enterobacteriaceae species NOT DETECTED NOT DETECTED Corrected   Enterobacter cloacae complex NOT DETECTED NOT DETECTED Corrected   Escherichia coli NOT DETECTED NOT DETECTED Corrected   Klebsiella oxytoca NOT DETECTED NOT DETECTED Corrected   Klebsiella pneumoniae NOT DETECTED NOT DETECTED Corrected   Proteus species NOT DETECTED NOT DETECTED Corrected   Serratia marcescens NOT DETECTED NOT DETECTED Corrected   Haemophilus influenzae NOT DETECTED NOT DETECTED Corrected   Neisseria meningitidis NOT DETECTED NOT DETECTED Corrected   Pseudomonas aeruginosa  NOT DETECTED NOT DETECTED Corrected   Candida albicans NOT DETECTED NOT DETECTED Corrected   Candida glabrata NOT DETECTED NOT DETECTED Corrected   Candida krusei NOT DETECTED NOT DETECTED Corrected   Candida parapsilosis NOT DETECTED NOT DETECTED Corrected   Candida tropicalis NOT DETECTED NOT DETECTED Corrected    Comment: Performed at [facility]  MRSA PCR Screening     Status: None   Collection Time: 10/16/15  1:50 PM  Result Value Ref Range Status   MRSA by PCR NEGATIVE NEGATIVE Final  Culture, blood (routine x 2)     Status: None   Collection Time: 10/17/15 10:58 AM  Result Value Ref Range Status   Specimen Description BLOOD RIGHT ARM  Final   Culture   Final    NO GROWTH 5 DAYS Performed at [facility]    Report Status 10/22/2015 FINAL  Final  Culture, blood (routine x 2)     Status: None   Collection Time: 10/17/15 11:01 AM  Result Value Ref Range Status   Specimen Description BLOOD LEFT HAND  Final   Special Requests IN PEDIATRIC BOTTLE 4 CC  Final   Culture   Final    NO GROWTH 5 DAYS    Report Status 10/22/2015 FINAL  Final  Culture, blood (routine x 2)     Status: None (Preliminary result)   Collection Time: 10/21/15 10:08 AM  Result Value Ref Range Status   Specimen Description BLOOD RIGHT LC  Final   Special Requests BOTTLES DRAWN AEROBIC AND ANAEROBIC 5CC  Final   Culture   Final    NO GROWTH < 24 HOURS Performed at [facility]    Report Status PENDING  Incomplete  Culture, blood (routine x 2)     Status: None (Preliminary result)   Collection Time: 10/21/15 10:09 AM  Result Value Ref Range Status   Specimen Description BLOOD RIGHT ARM  Final   Special Requests BOTTLES DRAWN AEROBIC AND ANAEROBIC 5CC  Final   Culture   Final    NO GROWTH < 24 HOURS Performed at [facility]    Report Status PENDING  Incomplete      Radiology Studies: Ct Chest Wo Contrast Result Date: 10/19/2015  1. An incidental finding of potential  clinical significance has been found.  3 cm mass in the upper portion of the stomach body. Although possibly from food material, this is non dependent in location and I am suspicious of a true gastric mass. Consider upper endoscopy when feasible. ** 2. Trace bilateral pleural effusions. 3. Healing fractures of the right eighth, ninth, and eleventh ribs. 4. Coronary, aortic arch, and branch vessel atherosclerotic vascular disease. Electronically Signed   By: Gaylyn Rong Scott.D.   On: 10/19/2015 23:34   Ct Maxillofacial W Contrast Result Date: 10/21/2015 1. Right upper periapical abscess with associated right maxillary sinus inflammation.    Scheduled Meds: . azithromycin  500 mg Intravenous Daily  . clindamycin (CLEOCIN) IV  600 mg Intravenous Q8H  . enoxaparin (LOVENOX) injection  40 mg Subcutaneous Q24H  . fluticasone  1 spray Each Nare BID  . folic acid  1 mg Oral Daily  . insulin aspart  0-9 Units Subcutaneous Q4H  . losartan  25 mg Oral q morning - 10a  . metoprolol  5 mg Intravenous Q6H  . multivitamin with minerals  1 tablet Oral Daily  . OLANZapine zydis  5 mg Oral QHS  . sodium chloride flush  3 mL Intravenous Q12H  . thiamine  100 mg Oral Daily   Or  . thiamine  100 mg Intravenous Daily  . traZODone  50 mg Oral QHS   Continuous Infusions: . sodium chloride 50 mL/hr at 10/23/15 0133     LOS: 7 days    Time spent: 25 minutes  Greater than 50% of the time spent on counseling and coordinating the care.   Manson Passey, MD [facility] Pager [phone]  If 7PM-7AM, please contact night-coverage [email or web address] Password [facility] 10/23/2015, 12:03 PM

## 2015-10-23 NOTE — Progress Notes (Signed)
Physical Therapy Treatment Patient Details Name: Charles CraftsJohn M Scott MRN: [id number] DOB: 10/09/1930 Today's Date: 10/23/2015    History of Present Illness 82 y.o. male with medical history significant for dementia who presents to hospital with altered mental status, metabolic encephalopathy    PT Comments    Pt requiring more assist for bed mobility and standing today then previous visit.  Updated d/c recommendation to SNF.  Follow Up Recommendations  SNF;Supervision/Assistance - 24 hour     Equipment Recommendations  Rolling [place] with 5" wheels    Recommendations for Other Services       Precautions / Restrictions Precautions Precautions: Fall    Mobility  Bed Mobility Overal bed mobility: Needs Assistance Bed Mobility: Supine to Sit;Sit to Supine     Supine to sit: Max assist Sit to supine: Mod assist   General bed mobility comments: increased assist for upper and lower body to move to EOB with multimodal cues, assist for legs onto bed upon return to supine  Transfers Overall transfer level: Needs assistance Equipment used: Rolling [place] (2 wheeled) Transfers: Sit to/from Stand Sit to Stand: Max assist;From elevated surface;+2 physical assistance         General transfer comment: assist to rise and steady, strong posterior lean today, performed twice, pt reports R dorsal foot pain limiting mobility, unable to correct lean with assist so did not ambulate for safety  Ambulation/Gait                 Stairs            Wheelchair Mobility    Modified Rankin (Stroke Patients Only)       Balance                                    Cognition Arousal/Alertness: Awake/alert Behavior During Therapy: Restless Overall Cognitive Status: Impaired/Different from baseline Area of Impairment: Following commands;Safety/judgement       Following Commands: Follows one step commands inconsistently Safety/Judgement: Decreased awareness of  deficits;Decreased awareness of safety     General Comments: sleeping on arrival and quickly returns to sleeping once back in supine    Exercises      General Comments        Pertinent Vitals/Pain Pain Assessment: Faces Faces Pain Scale: Hurts little more Pain Location: dorsal R foot Pain Descriptors / Indicators: Sore Pain Intervention(s): Limited activity within patient's tolerance;Monitored during session    Home Living                      Prior Function            PT Goals (current goals can now be found in the care plan section) Acute Rehab PT Goals PT Goal Formulation: Patient unable to participate in goal setting Time For Goal Achievement: 11/06/15 Potential to Achieve Goals: Good Progress towards PT goals: Progressing toward goals    Frequency  Min 3X/week    PT Plan Discharge plan needs to be updated    Co-evaluation             End of Session Equipment Utilized During Treatment: Gait belt Activity Tolerance: Patient limited by fatigue;Patient limited by pain Patient left: in bed;with call bell/phone within reach;with bed alarm set     Time: [id number] PT Time Calculation (min) (ACUTE ONLY): 14 min  Charges:  $Therapeutic Activity: 8-22 mins  G Codes:      Charles Scott,Charles Scott 15, 2017, 1:47 PM Charles Scott, PT, DPT 10/27/15 Pager: [phone]

## 2015-10-23 NOTE — Progress Notes (Signed)
Speech Language Pathology Treatment: Dysphagia  Patient Details Name: Charles CraftsJohn M Scott MRN: [id number] DOB: 06/15/1930 Today's Date: 10/23/2015 Time: [id number] SLP Time Calculation (min) (ACUTE ONLY): 18 min  Assessment / Plan / Recommendation Clinical Impression  Pt today is lethargic but would follow directions to protrude tongue, seal lips on suction and swallow!  Pt allowed SLP to provide oral care to remove copious dried secretions from oral cavity using toothette and oral suction.  Given pt's right peri-apical abscess and right maxillary sinus inflammation - recommend aggressive oral care within pt's comfort level.  Voice was clear but weak during session.   Pt also observed with ice chips, water via tsp, nectar thick juice and applesauce.  He did elicit swallow albeit delayed and with multiple swallows per bolus x 20% of boluses.  No indication of oral residuals or airway compromise noted.   Suspect if pt's mental status would improve, his swallow would be largely intact!    Recommend npo except single ice chips place on the left side due to dental abscess and agressive oral care.    Note palliative meeting pending, SlP to follow up to help with pt care plan.  RN educated.    HPI HPI: Pt is an 82 y.o. male with PMH of dementia presenting to ED 9/4 with AMS. At baseline pt ambulates without assistance and has "good days and bad days". Head CT showed nothing acute, chest CT Showed trace bilateral pleural effusions along with possible 3 cm gastric mass in upper portion of stomach body. Bedside swallow eval ordered, pt was too lethargic past date to participate.       SLP Plan  Continue with current plan of care;Other (Comment) (pending goals of care meeting)     Recommendations  Diet recommendations: NPO (ice chips only- single and presented on left side of mouth due to abscess) Liquids provided via: Teaspoon Medication Administration: Via alternative means Compensations: Other  (Comment) (assure pt swallows before giving more single ice chips) Postural Changes and/or Swallow Maneuvers: Seated upright 90 degrees             Oral Care Recommendations: Oral care QID;Oral care prior to ice chip/H20 Follow up Recommendations:  (tbd) Plan: Continue with current plan of care;Other (Comment) (pending goals of care meeting)     GO                Charles Scott, Charles Scott Charles Shizue Kaseman, MS [facility] SLP [phone]

## 2015-10-23 NOTE — Progress Notes (Signed)
Pt arouseable, very sleepy.  Remains NPO at this time.  Unable to assess orientation.  Will continue to monitor.

## 2015-10-23 NOTE — Consult Note (Signed)
Consultation Note Date: 10/23/2015   Patient Name: Charles Scott  DOB: 07-04-1930  MRN: [id number]  Age / Sex: 82 y.o., male  PCP: Kari Baars, MD Referring Physician: Alison Murray, MD  Reason for Consultation: Establishing goals of care  HPI/Patient Profile: 82 y.o. male    admitted on 10/16/2015     Clinical Assessment and Goals of Care:  82 year old gentleman with a known medical history significant for dementia. Wife who is a CNA is his primary caregiver. Patient has been having a lot of agitation episodes and falls. He has prior rib fractures from falls. Wife was taking care of him at home and has extensive caregiver burnout. Patient would have several falls and wife would have to call neighbors to pick him up. He has had gradual progressive decline ever since the last several months. For a few months he was sent to [facility] nursing home but wife states that he developed MRSA infection and she brought him home. Prior to this current hospitalization, patient's wife states," he just went wild." Patient has been admitted to the hospitalist service since 10/16/15. He was seen and evaluated by neurology on 10/17/15. Impression was that the patient has toxic metabolic encephalopathy. Recommendations were to continue Zyprexa, Ativan, Haldol, trazodone. Patient's mental status has not improved. Patient is not able to safely eat. Patient is requiring mittens and soft restraints. Patient has been diagnosed with maxillary sinusitis, tooth abscess/leukocytosis in this hospitalization. He remains on clindamycin and azithromycin. Incidentally, there is mention of gastric mass on the CT scan. Patient has ongoing worsening delirium on top of underlying dementia. Patient has underlying hypertension, diabetes, stage III chronic kidney disease. His CODE STATUS has previously been established as DO NOT RESUSCITATE/DO NOT INTUBATE.  Palliative care consultation has been placed for lack of improvement and additional goals of care discussions and to figure out an appropriate disposition planning.  Patient is an elderly appearing gentleman resting in bed. He opens his eyes when his name is called. He appears to attempt to take his hand mittens off. He is not able to verbalize much. He is not able to discuss at all. Call placed and discussed with wife really Dan Humphreys [phone]. She states she is a CNA and is his primary caregiver. She describes as being worn out. She complains of the patient's ongoing decline since the last several months. Patient has had severe episodes of agitation and falls recently. Patient's wife becomes tearful over the phone and states that he has a terrible quality of life. She states she is fully aware that what he has is ultimately not curative in nature. When asked what she means by that, she states she is referring to his dementia. Ultimately, she wishes for him to be kept as comfortable as possible. She is asking for the patient to be transferred to hospice facility near her - [place] [facility] through [facility] [place]. However, she wishes for me to discuss with their daughter Judeth Cornfield wall. Call placed and discussed with Judeth Cornfield at [phone] who subsequently  arrived at the hospital for face-to-face discussions.  Reviewed patient's pertinent lab and imaging results. CT chest showing possible gastric mass, CT maxillo facial results also discussed.   Daughter concurs with patient's wife. Son also called on phone. If patient has no significant improvement by 10-24-15, then consider the recommendations put forth below.   Thank you for the consult.   NEXT OF KIN  wife who is a Press photographer Son lives in [place], [place]   SUMMARY OF RECOMMENDATIONS    dnr dni Comfort measures Residential hospice, wife prefers [facility] Consider oral antibiotic  solution on d/c for R upper peri apical abscess.  No PEG,no artificial feeding Continue current symptom management measures  Code Status/Advance Care Planning:  DNR    Symptom Management:    continue current medications: on Zyprexa, Ativan, Haldol, Trazodone.   Palliative Prophylaxis:   Delirium Protocol     Psycho-social/Spiritual:   Desire for further Chaplaincy support:no  Additional Recommendations: Education on Hospice  Prognosis:   < 2 weeks possibly.   Discharge Planning: Hospice facility is recommended.       Primary Diagnoses: Present on Admission: . Altered awareness, transient . Encephalopathy   I have reviewed the medical record, interviewed the patient and family, and examined the patient. The following aspects are pertinent.  Past Medical History:  Diagnosis Date  . Dementia   . Diabetes mellitus   . Gout   . Hypertension   . Vitamin B 12 deficiency    Social History   Social History  . Marital status: Married    Spouse name: N/A  . Number of children: N/A  . Years of education: N/A   Social History Main Topics  . Smoking status: Current Some Day Smoker    Years: 60.00    Types: Cigars  . Smokeless tobacco: Never Used  . Alcohol use No  . Drug use: No  . Sexual activity: Yes    Birth control/ protection: None   Other Topics Concern  . None   Social History Narrative  . None   History reviewed. No pertinent family history. Scheduled Meds: . azithromycin  500 mg Intravenous Daily  . clindamycin (CLEOCIN) IV  600 mg Intravenous Q8H  . enoxaparin (LOVENOX) injection  40 mg Subcutaneous Q24H  . fluticasone  1 spray Each Nare BID  . folic acid  1 mg Oral Daily  . insulin aspart  0-9 Units Subcutaneous Q4H  . losartan  25 mg Oral q morning - 10a  . metoprolol  5 mg Intravenous Q6H  . multivitamin with minerals  1 tablet Oral Daily  . OLANZapine zydis  5 mg Oral QHS  . sodium chloride flush  3 mL Intravenous Q12H  . thiamine   100 mg Oral Daily   Or  . thiamine  100 mg Intravenous Daily  . traZODone  50 mg Oral QHS   Continuous Infusions: . sodium chloride 50 mL/hr at 10/23/15 0133   PRN Meds:.acetaminophen **OR** acetaminophen, haloperidol **OR** [DISCONTINUED] haloperidol lactate, hydrALAZINE, labetalol, ondansetron **OR** ondansetron (ZOFRAN) IV, sodium chloride Medications Prior to Admission:  Prior to Admission medications   Medication Sig Start Date End Date Taking? Authorizing Provider  ALPRAZolam (XANAX) 0.25 MG tablet Take 0.25 mg by mouth every 8 (eight) hours as needed for anxiety.   Yes Historical Provider, MD  glipiZIDE (GLUCOTROL) 10 MG tablet Take 10 mg by mouth daily before breakfast.    Yes Historical Provider, MD  insulin aspart (NOVOLOG) 100  UNIT/ML injection Inject 0-15 Units into the skin 3 (three) times daily as needed for high blood sugar (per sliding scale).   Yes Historical Provider, MD  losartan (COZAAR) 25 MG tablet Take 25 mg by mouth every morning. 06/16/15  Yes Historical Provider, MD  metoprolol (LOPRESSOR) 50 MG tablet Take 50 mg by mouth 2 (two) times daily.   Yes Historical Provider, MD  OLANZapine (ZYPREXA) 5 MG tablet Take 5 mg by mouth at bedtime.   Yes Historical Provider, MD  pioglitazone (ACTOS) 15 MG tablet Take 15 mg by mouth daily. 06/26/15  Yes Historical Provider, MD  traZODone (DESYREL) 50 MG tablet Take 50 mg by mouth at bedtime.   Yes Historical Provider, MD  LANTUS SOLOSTAR 100 UNIT/ML Solostar Pen Inject 70 Units into the skin daily at 10 pm.  06/16/15   Historical Provider, MD   Allergies  Allergen Reactions  . Penicillins Swelling    Has patient had a PCN reaction causing immediate rash, facial/tongue/throat swelling, SOB or lightheadedness with hypotension: yes Has patient had a PCN reaction causing severe rash involving mucus membranes or skin necrosis: unknown Has patient had a PCN reaction that required hospitalization: yes unsure if admitted but was taken to  ED Has patient had a PCN reaction occurring within the last 10 years: yes If all of the above answers are "NO", then may proceed with Cephalosporin use.    Review of Systems Non verbal   Physical Exam Elderly gentleman resting in bed Has mittens on Opens eyes when name is called Is not able to interact meaningfully Not agitated currently but is trying to get out of bed S1S2 Shallow clear breath sounds anteriorly Abdomen soft  Vital Signs: BP 126/66   Pulse 91   Temp 98 F (36.7 C) (Oral)   Resp 20   Ht 5\' 7"  (1.702 m)   Wt 77.1 kg (169 lb 15.6 oz)   SpO2 100%   BMI 26.62 kg/m  Pain Assessment: PAINAD   Pain Score: Asleep   SpO2: SpO2: 100 % O2 Device:SpO2: 100 % O2 Flow Rate: .   IO: Intake/output summary:  Intake/Output Summary (Last 24 hours) at 10/23/15 1613 Last data filed at 10/22/15 2300  Gross per 24 hour  Intake              450 ml  Output              550 ml  Net             -100 ml    LBM: Last BM Date:  (PTA, UTA d/t patient's dementia) Baseline Weight: Weight: 81.6 kg (180 lb) Most recent weight: Weight: 77.1 kg (169 lb 15.6 oz)     Palliative Assessment/Data:   Flowsheet Rows   Flowsheet Row Most Recent Value  Intake Tab  Referral Department  Hospitalist  Unit at Time of Referral  Med/Surg Unit  Palliative Care Primary Diagnosis  Neurology  Date Notified  10/23/15  Palliative Care Type  New Palliative care  Reason for referral  Clarify Goals of Care  Date of Admission  10/16/15  Date first seen by Palliative Care  10/23/15  # of days IP prior to Palliative referral  7  Clinical Assessment  Palliative Performance Scale Score  20%  Pain Max last 24 hours  4  Pain Min Last 24 hours  3  Dyspnea Max Last 24 Hours  3  Dyspnea Min Last 24 hours  2  Nausea Max Last 24  Hours  3  Nausea Min Last 24 Hours  2  Anxiety Max Last 24 Hours  3  Anxiety Min Last 24 Hours  2  Psychosocial & Spiritual Assessment  Palliative Care Outcomes    Patient/Family meeting held?  Yes  Who was at the meeting?  wife over phone, daughter in person.   Palliative Care follow-up planned  Yes, Facility      Time In:  1600 Time Out:  1710 Time Total:  70 min  Greater than 50%  of this time was spent counseling and coordinating care related to the above assessment and plan.  Signed by: Rosalin Hawking, MD  [phone] Please contact Palliative Medicine Team phone at [phone] for questions and concerns.  For individual provider: See Loretha Stapler

## 2015-10-23 NOTE — Care Management Note (Signed)
Case Management Note  Patient Details  Name: Rocky CraftsJohn M Hunt MRN: [id number] Date of Birth: 09/21/1930  Subjective/Objective: 82 y/o m admitted w/AMS. From home.Hx:DNR.lethargic-NPO. Palliative cons-await recc. CM/CSW following.                  Action/Plan:d/c plan home.   Expected Discharge Date:   (UNKNOWN)               Expected Discharge Plan:  Home w Home Health Services  In-House Referral:  Clinical Social Work  Discharge planning Services  CM Consult  Post Acute Care Choice:    Choice offered to:     DME Arranged:    DME Agency:     HH Arranged:    HH Agency:     Status of Service:  In process, will continue to follow  If discussed at Long Length of Stay Meetings, dates discussed:    Additional Comments:  Lanier ClamMahabir, Amour Trigg, RN 10/23/2015, 1:08 PM

## 2015-10-24 DIAGNOSIS — K047 Periapical abscess without sinus: Secondary | ICD-10-CM

## 2015-10-24 DIAGNOSIS — B954 Other streptococcus as the cause of diseases classified elsewhere: Secondary | ICD-10-CM

## 2015-10-24 LAB — CBC
HCT: 40 % (ref 39.0–52.0)
Hemoglobin: 13.9 g/dL (ref 13.0–17.0)
MCH: 33.2 pg (ref 26.0–34.0)
MCHC: 34.8 g/dL (ref 30.0–36.0)
MCV: 95.5 fL (ref 78.0–100.0)
Platelets: 227 K/uL (ref 150–400)
RBC: 4.19 MIL/uL — ABNORMAL LOW (ref 4.22–5.81)
RDW: 13.9 % (ref 11.5–15.5)
WBC: 7.6 K/uL (ref 4.0–10.5)

## 2015-10-24 LAB — BASIC METABOLIC PANEL WITH GFR
Anion gap: 10 (ref 5–15)
BUN: 30 mg/dL — ABNORMAL HIGH (ref 6–20)
CO2: 23 mmol/L (ref 22–32)
Calcium: 9 mg/dL (ref 8.9–10.3)
Chloride: 104 mmol/L (ref 101–111)
Creatinine, Ser: 1.35 mg/dL — ABNORMAL HIGH (ref 0.61–1.24)
GFR calc Af Amer: 55 mL/min — ABNORMAL LOW
GFR calc non Af Amer: 47 mL/min — ABNORMAL LOW
Glucose, Bld: 165 mg/dL — ABNORMAL HIGH (ref 65–99)
Potassium: 3.5 mmol/L (ref 3.5–5.1)
Sodium: 137 mmol/L (ref 135–145)

## 2015-10-24 LAB — GLUCOSE, CAPILLARY
GLUCOSE-CAPILLARY: 173 mg/dL — AB (ref 65–99)
Glucose-Capillary: 145 mg/dL — ABNORMAL HIGH (ref 65–99)
Glucose-Capillary: 157 mg/dL — ABNORMAL HIGH (ref 65–99)

## 2015-10-24 MED ORDER — ADULT MULTIVITAMIN W/MINERALS CH: 1.0000 | ORAL_TABLET | Freq: Every day | ORAL | 0 refills | Status: AC

## 2015-10-24 MED ORDER — THIAMINE HCL 100 MG PO TABS: 100.0000 mg | ORAL_TABLET | Freq: Every day | ORAL | 0 refills | Status: AC

## 2015-10-24 MED ORDER — FOLIC ACID 1 MG PO TABS: 1.0000 mg | ORAL_TABLET | Freq: Every day | ORAL | 0 refills | Status: AC

## 2015-10-24 MED ORDER — HALOPERIDOL 2 MG PO TABS: 2.0000 mg | ORAL_TABLET | ORAL | 0 refills | Status: AC | PRN

## 2015-10-24 MED ORDER — ONDANSETRON HCL 4 MG PO TABS: 4.0000 mg | ORAL_TABLET | Freq: Four times a day (QID) | ORAL | 0 refills | Status: AC | PRN

## 2015-10-24 MED ORDER — LORAZEPAM 2 MG/ML IJ SOLN
1.0000 mg | Freq: Four times a day (QID) | INTRAMUSCULAR | Status: DC | PRN
  Administered 2015-10-24: 1 mg via INTRAVENOUS
  Filled 2015-10-24: qty 1

## 2015-10-24 NOTE — Progress Notes (Signed)
Report given to Hansel StarlingKim Bennett RN at [facility].  [facility] here to transport patient.

## 2015-10-24 NOTE — Progress Notes (Signed)
SLP Cancellation Note  Patient Details Name: Charles CraftsJohn M Farler MRN: [id number] DOB: 03/22/1930   Cancelled treatment:       Reason Eval/Treat Not Completed: Other (comment) (pt now full comfort care only; will sign off; thanks)   Donavan Burnetamara Tasman Zapata, MS [facility] S[facility] [phone]

## 2015-10-24 NOTE — Discharge Summary (Signed)
Physician Discharge Summary  Charles CraftsJohn M Scott ZOX:[id number] DOB: 07/16/1930 DOA: 10/16/2015  PCP: Charles MaudlinHAWKINS,EDWARD L, MD  Admit date: 10/16/2015 Discharge date: 10/24/2015  Recommendations for Outpatient Follow-up:  1. Continue as needed haldol and/or ativan for restlessness or agitation   Discharge Diagnoses:  Active Problems:   Altered awareness, transient   Encephalopathy   Encounter for palliative care   Goals of care, counseling/discussion   Tooth abscess  Discharge Condition: stable   Diet recommendation: as tolerated   History of present illness:  82 year old male with past medical history of dementia, on antipsychotics at home. Patient presented with worsening mental status. He was found to have a right upper tooth abscess on the admission and started on IV clindamycin. This was discussed with ENT who recommended IV clindamycin and if patient continues to spike fever to have official dental consult. Patient has not spiked fever since.   Hospital Course:    Assessment & Plan:  Acute metabolic encephalopathy / dementia with behavioral disturbance - Likely worsening dementia - Patient has mittens on as he was pulling on IV lines - Patient can have Ativan and Haldol on as-needed basis for agitation - Continue olanzapine  Incidental gastric mass on CT  - No further diagnostic or therapeutic intervention per family wishes - Focus on comfort care at this point  Maxillary sinusitis - Stop azithromycin today   Tooth abscess / Leukocytosis / Strep viridans bacteremia  - Continue clindamycin through today - Per family, transition to comfort care - No further abx required on discharge  - Please note one of the blood cultures on the admission was growing strep viridans but repeat blood cultures negative. Because focus is on comfort care at this point no further antibiotics are required.  HTN, essential - Continue metoprolol  Type 2 diabetes mellitus with diabetic  nephropathy on chronic current insulin use - Continue sliding scale insulin on discharge   Chronic kidney disease stage III - Creatinine stable at 1.34    DVT prophylaxis: Lovenox subcutaneous in hosptial  Code Status: DNR/DNI  Family Communication: Family not at the bedside this morning   Consultants:   Neurology  Palliative care  Procedures:   None   Antimicrobials:   Azithromycin 9/4 -->9/12  Clinda 9/9 --> 9/12    Signed:  Manson PasseyEVINE, Sheanna Dail, MD  [facility] 10/24/2015, 12:10 PM  Pager #: [phone]  Time spent in minutes: less than 30 minutes   Discharge Exam: Vitals:   10/24/15 0451 10/24/15 0551  BP: (!) 153/77   Pulse: 99   Resp: 20 20  Temp: 98.3 F (36.8 C)    Vitals:   10/23/15 1741 10/23/15 1830 10/24/15 0451 10/24/15 0551  BP: (!) 149/70 (!) 145/74 (!) 153/77   Pulse: 96 96 99   Resp:  20 20 20   Temp:  98.4 F (36.9 C) 98.3 F (36.8 C)   TempSrc:  Oral Oral   SpO2:  100% 100%   Weight:      Height:        General: Pt is not in acute distress Cardiovascular: Rate controlled, S1/S2 + Respiratory:no wheezing, no crackles, no rhonchi Abdominal: Soft, non tender, non distended, bowel sounds +, no guarding Extremities: no edema, pulses palpable bilaterally DP and PT Neuro: Grossly nonfocal  Discharge Instructions  Discharge Instructions    Call MD for:  difficulty breathing, headache or visual disturbances    Complete by:  As directed   Call MD for:  persistant nausea and vomiting  Complete by:  As directed   Call MD for:  severe uncontrolled pain    Complete by:  As directed   Diet - low sodium heart healthy    Complete by:  As directed   Increase activity slowly    Complete by:  As directed       Medication List    STOP taking these medications   glipiZIDE 10 MG tablet Commonly known as:  GLUCOTROL   LANTUS SOLOSTAR 100 UNIT/ML Solostar Pen Generic drug:  Insulin Glargine   losartan 25 MG tablet Commonly  known as:  COZAAR   pioglitazone 15 MG tablet Commonly known as:  ACTOS     TAKE these medications   ALPRAZolam 0.25 MG tablet Commonly known as:  XANAX Take 0.25 mg by mouth every 8 (eight) hours as needed for anxiety.   folic acid 1 MG tablet Commonly known as:  FOLVITE Take 1 tablet (1 mg total) by mouth daily.   haloperidol 2 MG tablet Commonly known as:  HALDOL Take 1 tablet (2 mg total) by mouth every 4 (four) hours as needed for agitation.   insulin aspart 100 UNIT/ML injection Commonly known as:  novoLOG Inject 0-15 Units into the skin 3 (three) times daily as needed for high blood sugar (per sliding scale).   metoprolol 50 MG tablet Commonly known as:  LOPRESSOR Take 50 mg by mouth 2 (two) times daily.   multivitamin with minerals Tabs tablet Take 1 tablet by mouth daily.   OLANZapine 5 MG tablet Commonly known as:  ZYPREXA Take 5 mg by mouth at bedtime.   ondansetron 4 MG tablet Commonly known as:  ZOFRAN Take 1 tablet (4 mg total) by mouth every 6 (six) hours as needed for nausea.   thiamine 100 MG tablet Take 1 tablet (100 mg total) by mouth daily.   traZODone 50 MG tablet Commonly known as:  DESYREL Take 50 mg by mouth at bedtime.         The results of significant diagnostics from this hospitalization (including imaging, microbiology, ancillary and laboratory) are listed below for reference.    Significant Diagnostic Studies: Dg Chest 2 View  Result Date: 10/16/2015 CLINICAL DATA:  82 year old male with a history of lethargy, code sepsis, not responding EXAM: CHEST  2 VIEW COMPARISON:  08/20/2015, 06/29/2015 FINDINGS: Cardiomediastinal silhouette unchanged. Calcifications of the aorta. Low lung volumes crowding the interstitium and the central vasculature. Lung apices not well evaluated given the overlying soft tissues of the neck. Linear opacities at the lung bases. No pleural effusion or pneumothorax. IMPRESSION: Low lung volumes with linear  basilar opacities, potentially represent atelectasis and/or consolidation. Aortic atherosclerosis. Signed, Yvone Neu. Loreta Ave, DO [facility] Radiology Electronically Signed   By: Gilmer Mor D.O.   On: 10/16/2015 11:08   Ct Head Wo Contrast  Result Date: 10/16/2015 CLINICAL DATA:  82 year old male with a history of lethargy EXAM: CT HEAD WITHOUT CONTRAST TECHNIQUE: Contiguous axial images were obtained from the base of the skull through the vertex without intravenous contrast. COMPARISON:  10/09/2009, CT 08/20/2015 FINDINGS: Unremarkable appearance of the calvarium without acute fracture or aggressive lesion. Unremarkable appearance of the scalp soft tissues. Unremarkable appearance of the orbits. Visualized aspects of the right maxillary sinus demonstrate persisting complete opacification with high density material. As was seen on the comparison CT of 08/20/2015, there is expansion of the medial maxillary wall. Left maxillary sinuses clear. No acute intracranial hemorrhage. No midline shift or mass effect.  Gray-white differentiation is maintained. Mild volume loss. Unchanged configuration of the ventricles. Intracranial atherosclerosis. IMPRESSION: No CT evidence of acute intracranial abnormality. Complete opacification of the partially visualized maxillary sinus with high density material. Given today's appearance and the appearance on prior CT 08/20/2015, differential diagnosis should include mucocele with remodeling of the sinus, chronic obstruction with inspissated secretions, and fungal colonization. Less likely, the findings may be related to dentigerous cyst or other dental process. ENT evaluation may be considered. These results were called by telephone at the time of interpretation on 10/16/2015 at 11:25 am to Dr. Melene Plan , who verbally acknowledged these results. Signed, Yvone Neu. Loreta Ave, DO [facility]  Radiology Electronically Signed   By: Gilmer Mor D.O.   On: 10/16/2015 11:28   Ct Chest Wo Contrast  Result Date: 10/19/2015 CLINICAL DATA:  Metabolic encephalopathy with delirium. 82 yo male with moderate dementia, on antipsychotics at home. Presented with acute decompensation. Dyspnea. EXAM: CT CHEST WITHOUT CONTRAST TECHNIQUE: Multidetector CT imaging of the chest was performed following the standard protocol without IV contrast. COMPARISON:  10/17/2015 FINDINGS: Cardiovascular: Coronary, aortic arch, and branch vessel atherosclerotic vascular disease. Mediastinum/Nodes: Unremarkable Lungs/Pleura: Trace bilateral pleural effusions. Otherwise unremarkable Upper Abdomen: Cholecystectomy. Punctate calcification posteriorly in the right hepatic lobe on image 111/2 likely a focus of prior inflammation or remote granulomatous process. Along a non dependent part of the upper stomach body, there seems to be a focal wall thickening measuring approximately 1.7 by 2.8 by 3.0 cm, for example on image 102/2. This protrudes down into the lumen. Musculoskeletal: Subacute/healing fractures of the right eighth, ninth, and eleventh ribs; these ribs and adjacent ribs are incompletely visualized and accordingly there could be other fractures. One of these was seen on the rib images from 08/20/2015 and the degree of callus formation is consistent with injury at that time. IMPRESSION: 1. **An incidental finding of potential clinical significance has been found. 3 cm mass in the upper portion of the stomach body. Although possibly from food material, this is non dependent in location and I am suspicious of a true gastric mass. Consider upper endoscopy when feasible. ** 2. Trace bilateral pleural effusions. 3. Healing fractures of the right eighth, ninth, and eleventh ribs. 4. Coronary, aortic arch, and branch vessel atherosclerotic vascular disease. Electronically Signed   By: Gaylyn Rong M.D.   On: 10/19/2015 23:34   Ct  Maxillofacial W Contrast  Result Date: 10/21/2015 CLINICAL DATA:  Evaluate for tooth abscess. EXAM: CT MAXILLOFACIAL WITH CONTRAST TECHNIQUE: Multidetector CT imaging of the maxillofacial structures was performed with intravenous contrast. Multiplanar CT image reconstructions were also generated. A small metallic BB was placed on the right temple in order to reliably differentiate right from left. CONTRAST:  60mL ISOVUE-300 IOPAMIDOL (ISOVUE-300) INJECTION 61% COMPARISON:  10/16/2015 FINDINGS: There is moderate asymmetric mucosal thickening involving the right maxillary sinus. Periapical abscess is identified involving the right upper second molar. There is erosion of the abscess through the floor of the right maxillary sinus, image 27 of series 602. The visualized intracranial contents are unremarkable. No drainable soft tissue abscess identified. The orbits appear normal. IMPRESSION: 1. Right upper periapical abscess with associated right maxillary sinus inflammation. Electronically Signed   By: Signa Kell M.D.   On: 10/21/2015 18:26   Dg Chest Port 1 View  Result Date: 10/17/2015 CLINICAL DATA:  Altered mental status, progressive deterioration, history of encephalopathy and dementia. Also diabetes, current smoker. EXAM: PORTABLE CHEST 1 VIEW COMPARISON:  PA and lateral chest x-ray of October 16, 2015 FINDINGS: The lungs are well-expanded and clear. The heart and pulmonary vascularity are normal. The mediastinum is normal in width. There is calcification in the wall of the aortic arch. The bony thorax exhibits no acute abnormality. IMPRESSION: There is no active cardiopulmonary disease. Aortic atherosclerosis. Electronically Signed   By: David  [place] M.D.   On: 10/17/2015 11:07    Microbiology: Recent Results (from the past 240 hour(s))  Blood Culture (routine x 2)     Status: None   Collection Time: 10/16/15 10:30 AM  Result Value Ref Range Status   Specimen Description BLOOD BLOOD RIGHT FOREARM   Final   Special Requests BOTTLES DRAWN AEROBIC AND ANAEROBIC 5CC  Final   Culture   Final    NO GROWTH 5 DAYS Performed at [facility]    Report Status 10/21/2015 FINAL  Final  Urine culture     Status: None   Collection Time: 10/16/15 10:37 AM  Result Value Ref Range Status   Specimen Description URINE, RANDOM  Final   Special Requests NONE  Final   Culture NO GROWTH Performed at [facility]   Final   Report Status 10/17/2015 FINAL  Final  Blood Culture (routine x 2)     Status: Abnormal   Collection Time: 10/16/15 10:40 AM  Result Value Ref Range Status   Specimen Description BLOOD BLOOD LEFT FOREARM  Final   Special Requests BOTTLES DRAWN AEROBIC AND ANAEROBIC 5CC  Final   Culture  Setup Time   Final    GRAM POSITIVE COCCI IN CHAINS ANAEROBIC BOTTLE ONLY CRITICAL RESULT CALLED TO, READ BACK BY AND VERIFIED WITH: J LEGGE 10/17/15 @ 0921 M VESTAL    Culture (A)  Final    VIRIDANS STREPTOCOCCUS THE SIGNIFICANCE OF ISOLATING THIS ORGANISM FROM A SINGLE SET OF BLOOD CULTURES WHEN MULTIPLE SETS ARE DRAWN IS UNCERTAIN. PLEASE NOTIFY THE MICROBIOLOGY DEPARTMENT WITHIN ONE WEEK IF SPECIATION AND SENSITIVITIES ARE REQUIRED. Performed at [facility]    Report Status 10/19/2015 FINAL  Final  Blood Culture ID Panel (Reflexed)     Status: Abnormal   Collection Time: 10/16/15 10:40 AM  Result Value Ref Range Status   Enterococcus species NOT DETECTED NOT DETECTED Corrected   Listeria monocytogenes NOT DETECTED NOT DETECTED Corrected   Staphylococcus species NOT DETECTED NOT DETECTED Corrected   Staphylococcus aureus NOT DETECTED NOT DETECTED Corrected   Streptococcus species DETECTED (A) NOT DETECTED Corrected    Comment: CRITICAL RESULT CALLED TO, READ BACK BY AND VERIFIED WITH: J LEGGE 10/17/15 @ 0921 M VESTAL    Streptococcus agalactiae NOT DETECTED NOT DETECTED Corrected   Streptococcus pneumoniae NOT DETECTED NOT DETECTED Corrected   Streptococcus pyogenes  NOT DETECTED NOT DETECTED Corrected   Acinetobacter baumannii NOT DETECTED NOT DETECTED Corrected   Enterobacteriaceae species NOT DETECTED NOT DETECTED Corrected   Enterobacter cloacae complex NOT DETECTED NOT DETECTED Corrected   Escherichia coli NOT DETECTED NOT DETECTED Corrected   Klebsiella oxytoca NOT DETECTED NOT DETECTED Corrected   Klebsiella pneumoniae NOT DETECTED NOT DETECTED Corrected   Proteus species NOT DETECTED NOT DETECTED Corrected   Serratia marcescens NOT DETECTED NOT DETECTED Corrected   Haemophilus influenzae NOT DETECTED NOT DETECTED Corrected   Neisseria meningitidis NOT DETECTED NOT DETECTED Corrected   Pseudomonas aeruginosa NOT DETECTED NOT DETECTED Corrected   Candida albicans NOT DETECTED NOT DETECTED Corrected   Candida glabrata NOT DETECTED NOT DETECTED Corrected   Candida krusei NOT  DETECTED NOT DETECTED Corrected   Candida parapsilosis NOT DETECTED NOT DETECTED Corrected   Candida tropicalis NOT DETECTED NOT DETECTED Corrected    Comment: Performed at [facility]  MRSA PCR Screening     Status: None   Collection Time: 10/16/15  1:50 PM  Result Value Ref Range Status   MRSA by PCR NEGATIVE NEGATIVE Final  Culture, blood (routine x 2)     Status: None   Collection Time: 10/17/15 10:58 AM  Result Value Ref Range Status   Specimen Description BLOOD RIGHT ARM  Final   Special Requests BOTTLES DRAWN AEROBIC AND ANAEROBIC 10 CC EA  Final   Culture   Final    NO GROWTH 5 DAYS Performed at [facility]    Report Status 10/22/2015 FINAL  Final  Culture, blood (routine x 2)     Status: None   Collection Time: 10/17/15 11:01 AM  Result Value Ref Range Status   Specimen Description BLOOD LEFT HAND  Final   Special Requests IN PEDIATRIC BOTTLE 4 CC  Final   Culture   Final    NO GROWTH 5 DAYS Performed at [facility]    Report Status 10/22/2015 FINAL  Final  Culture, blood (routine x 2)     Status: None (Preliminary result)    Collection Time: 10/21/15 10:08 AM  Result Value Ref Range Status   Specimen Description BLOOD RIGHT LC  Final   Special Requests BOTTLES DRAWN AEROBIC AND ANAEROBIC 5CC  Final   Culture   Final    NO GROWTH 2 DAYS Performed at [facility]    Report Status PENDING  Incomplete  Culture, blood (routine x 2)     Status: None (Preliminary result)   Collection Time: 10/21/15 10:09 AM  Result Value Ref Range Status   Specimen Description BLOOD RIGHT ARM  Final   Special Requests BOTTLES DRAWN AEROBIC AND ANAEROBIC 5CC  Final   Culture   Final    NO GROWTH 2 DAYS Performed at [facility]    Report Status PENDING  Incomplete     Labs: Basic Metabolic Panel:  Recent Labs Lab 10/19/15 0453 10/20/15 0511 10/22/15 0732 10/23/15 0328 10/24/15 0513  NA 138 138 139 140 137  K 4.0 4.0 3.9 4.2 3.5  CL 105 106 108 104 104  CO2 26 24 24 25 23   GLUCOSE 204* 188* 191* 157* 165*  BUN 17 20 28* 27* 30*  CREATININE 1.41* 1.42* 1.51* 1.34* 1.35*  CALCIUM 9.2 9.2 8.7* 9.1 9.0   Liver Function Tests: No results for input(s): AST, ALT, ALKPHOS, BILITOT, PROT, ALBUMIN in the last 168 hours. No results for input(s): LIPASE, AMYLASE in the last 168 hours. No results for input(s): AMMONIA in the last 168 hours. CBC:  Recent Labs Lab 10/18/15 0519 10/19/15 0453 10/20/15 0511 10/22/15 0732 10/23/15 0328 10/24/15 0513  WBC 7.7 8.1 8.2 10.0 10.8* 7.6  NEUTROABS 5.3  --   --   --   --   --   HGB 15.1 14.3 13.9 13.5 14.8 13.9  HCT 42.4 40.9 41.0 39.1 43.3 40.0  MCV 94.2 94.2 96.2 96.5 96.9 95.5  PLT 169 181 180 183 230 227   Cardiac Enzymes: No results for input(s): CKTOTAL, CKMB, CKMBINDEX, TROPONINI in the last 168 hours. BNP: BNP (last 3 results) No results for input(s): BNP in the last 8760 hours.  ProBNP (last 3 results) No results for input(s): PROBNP in the last 8760 hours.  CBG:  Recent Labs Lab 10/23/15 2225 10/23/15 2359 10/24/15 0134 10/24/15 0448  10/24/15 0738  GLUCAP 197* 187* 145* 157* 173*

## 2015-10-24 NOTE — Progress Notes (Signed)
Patient is set to [facility] today. Patient & daughter, Judeth CornfieldStephanie & wife made aware. Discharge packet given to RN, Darcel BayleyKamna. [facility] called for transport.     Lincoln MaxinKelly Jaelan Rasheed, LCSW [facility] Clinical Social Worker cell #: [phone]

## 2015-10-24 NOTE — Clinical Social Work Note (Signed)
Palliative Care recommending residential hospice placement. [facility] [place].  MSW has notified CSW of consult. This MSW signing off.   Derenda FennelBashira Kenric Ginger, MSW [phone] 10/24/2015 11:39 AM

## 2015-10-24 NOTE — Progress Notes (Signed)
Nutrition Brief Note  Patient identified on Low Braden Score list.  Chart reviewed. Pt now transitioning to comfort care.  No nutrition interventions warranted at this time.  Please consult as needed.   Franceen Erisman, MS, RD, LDN Pager: [phone] After Hours Pager: [phone]    

## 2015-10-24 NOTE — Progress Notes (Signed)
CSW confirmed with patient's daughter, Charles Scott (ph#: [phone]) that they are agreeable with plan for residential hospice & would [facility]. CSW made referral to [place] [facility], awaiting call back re: bed availability/eligibility.    Lincoln MaxinKelly Yanelle Sousa, LCSW [facility] Clinical Social Worker cell #: [phone]

## 2015-10-24 NOTE — Discharge Instructions (Signed)
Hospice °Hospice is a service that is designed to provide people who are terminally ill and their families with medical, spiritual, and psychological support. Its aim is to improve your quality of life by keeping you as alert and comfortable as possible. Hospice is performed by a team of health care professionals and volunteers who: °· Help keep you comfortable. Hospice can be provided in your home or in a homelike setting. The hospice staff works with your family and friends to help meet your needs. You will enjoy the support of loved ones by receiving much of your basic care from family and friends. °· Provide pain relief and manage your symptoms. The staff supply all necessary medicines and equipment. °· Provide companionship when you are alone. °· Allow you and your family to rest. They may do light housekeeping, prepare meals, and run errands. °· Provide counseling. They will make sure your emotional, spiritual, and social needs and those of your family are being met. °· Provide spiritual care. Spiritual care is individualized to meet your needs and your family's needs. It may involve helping you look at what death means to you, say goodbye, or perform a specific religious ceremony or ritual. °Hospice teams often include: °· A nurse. °· A doctor. °· Social workers. °· Religious leaders (such as a chaplain). °· Trained volunteers. °WHEN SHOULD HOSPICE CARE BEGIN? °Most people who use hospice are believed to have fewer than 6 months to live. Your family and health care providers can help you decide when hospice services should begin. If your condition improves, you may discontinue the program. °WHAT SHOULD I CONSIDER BEFORE SELECTING A PROGRAM? °Most hospice programs are run by nonprofit, independent organizations. Some are affiliated with hospitals, nursing homes, or home health care agencies. Hospice programs can take place in the home or at a hospice center, hospital, or skilled nursing facility. When choosing  a hospice program, ask the following questions: °· What services are available to me? °· What services are offered to my loved ones? °· How involved are my loved ones? °· How involved is my health care provider? °· Who makes up the hospice care team? How are they trained or screened? °· How will my pain and symptoms be managed? °· If my circumstances change, can the services be provided in a different setting, such as my home or in the hospital? °· Is the program reviewed and licensed by the state or certified in some other way? °WHERE CAN I LEARN MORE ABOUT HOSPICE? °You can learn about existing hospice programs in your area from your health care providers. You can also read more about hospice online. The websites of the following organizations contain helpful information: °· The National Hospice and Palliative Care Organization (NHPCO). °· The Hospice Association of America (HAA). °· The Hospice Education Institute. °· The American Cancer Society (ACS). °· Hospice Net. °  °This information is not intended to replace advice given to you by your health care provider. Make sure you discuss any questions you have with your health care provider. °  °Document Released: 05/17/2003 Document Revised: 02/02/2013 Document Reviewed: 12/08/2012 °Elsevier Interactive Patient Education ©2016 [employer]. ° °

## 2015-10-26 LAB — CULTURE, BLOOD (ROUTINE X 2)
Culture: NO GROWTH
Culture: NO GROWTH

## 2015-11-12 DEATH — deceased

## 2017-09-01 IMAGING — CT CT MAXILLOFACIAL W/ CM
3 series · 16 of 47 positions shown, 19 images · IV contrast (iopamidol)
Comparison: 10/16/2015

CLINICAL DATA: Evaluate for tooth abscess.

EXAM:
CT MAXILLOFACIAL WITH CONTRAST
TECHNIQUE: Multidetector CT imaging of the maxillofacial structures was
performed with intravenous contrast. Multiplanar CT image
reconstructions were also generated. A small metallic BB was placed
on the right temple in order to reliably differentiate right from
left.
CONTRAST:  60mL KWNCSA-NPP IOPAMIDOL (KWNCSA-NPP) INJECTION 61%

[Series 3: maxillofacial 2.0 h32s · axial · 0.35mm/px · z∈[-239,-115]mm · 10 of 74 slices shown, 13 images]
[im 6/74  brain]
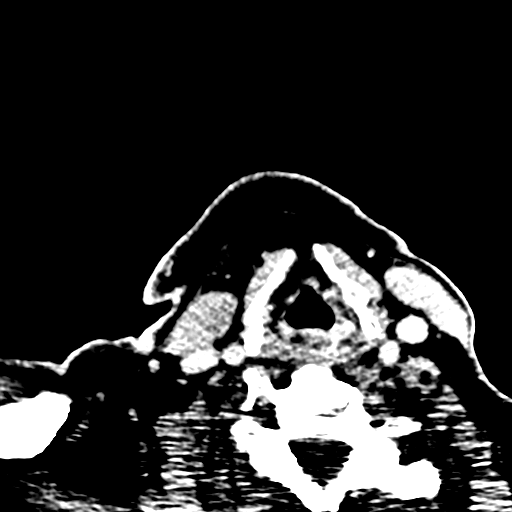
[im 6/74  bone]
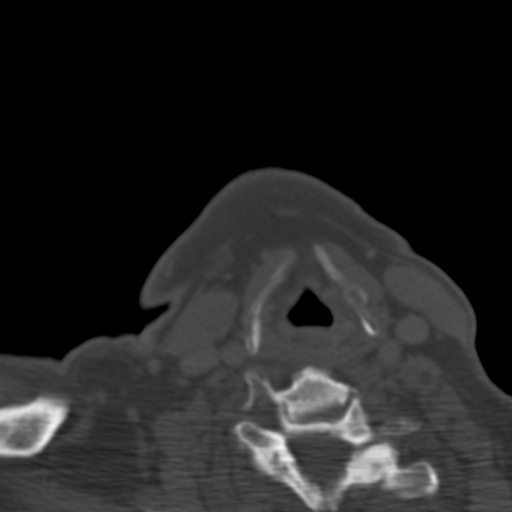
[im 13/74  bone]
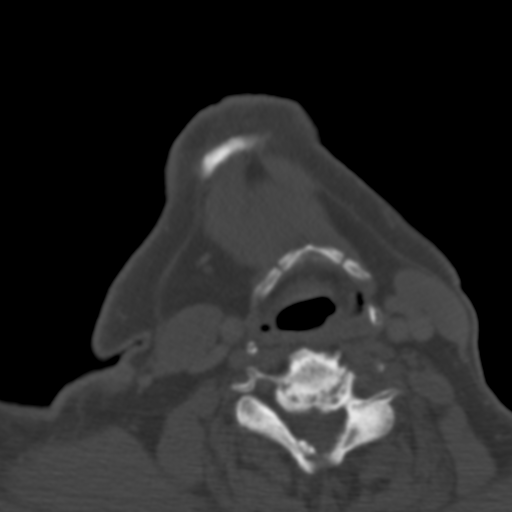
[im 21/74  bone]
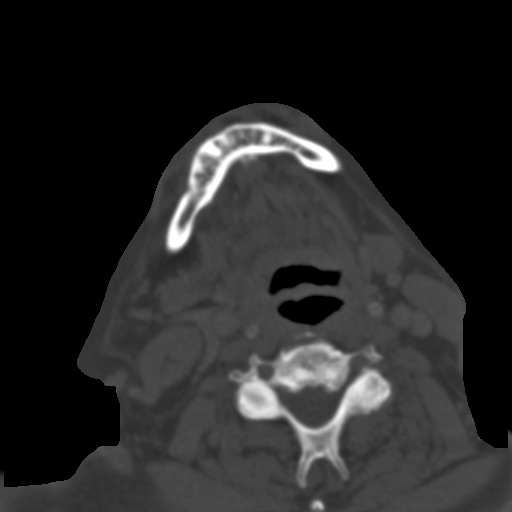
[im 26/74  bone]
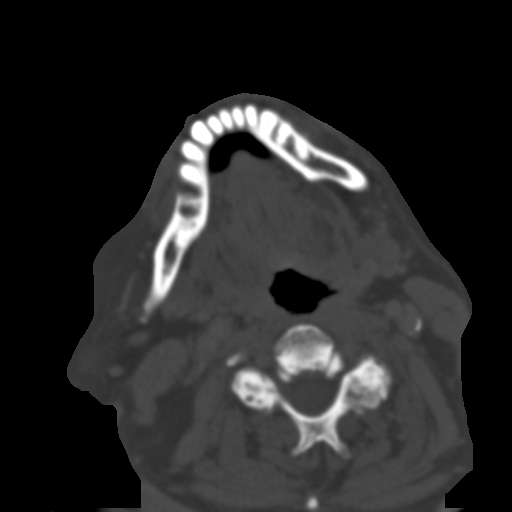
[im 33/74  brain]
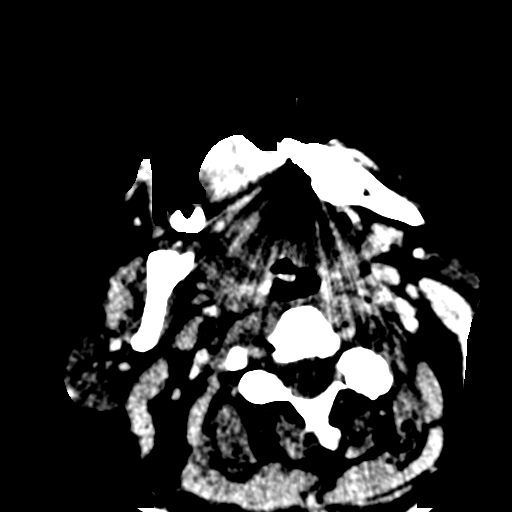
[im 33/74  bone]
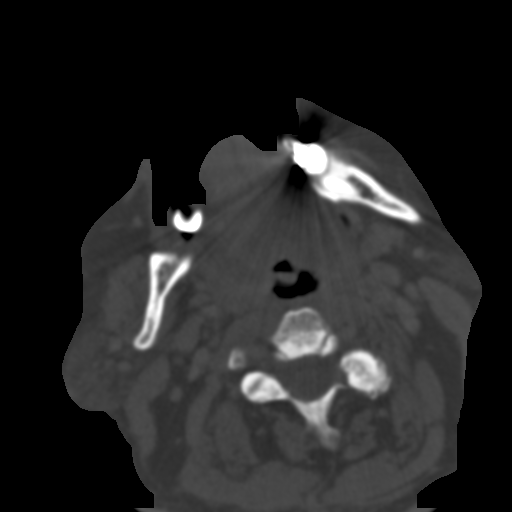
[im 41/74  bone]
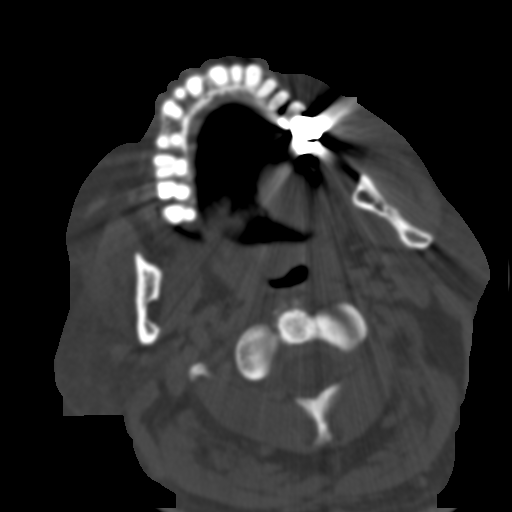
[im 48/74  bone]
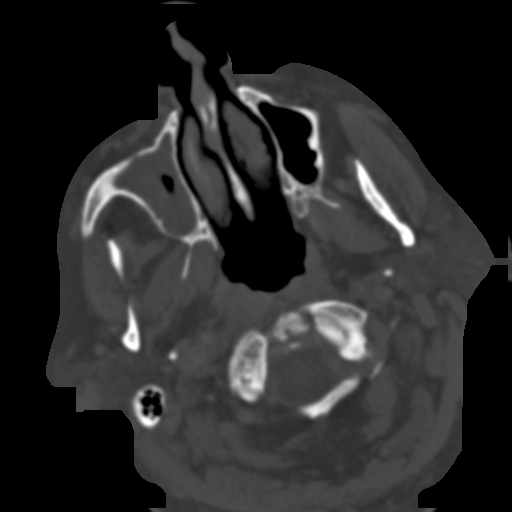
[im 56/74  bone]
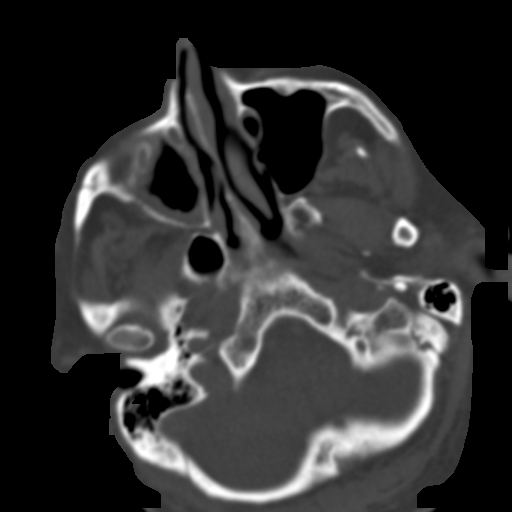
[im 61/74  brain]
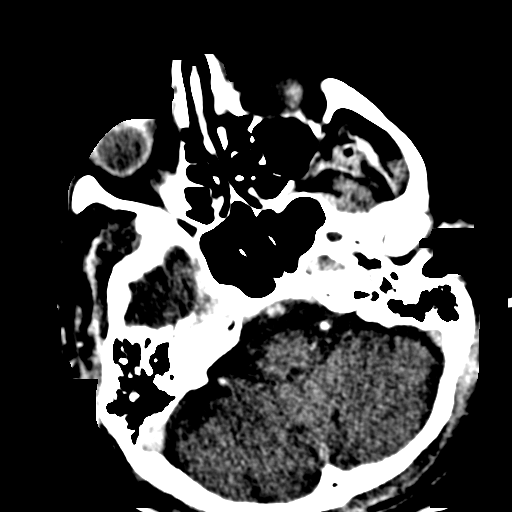
[im 61/74  bone]
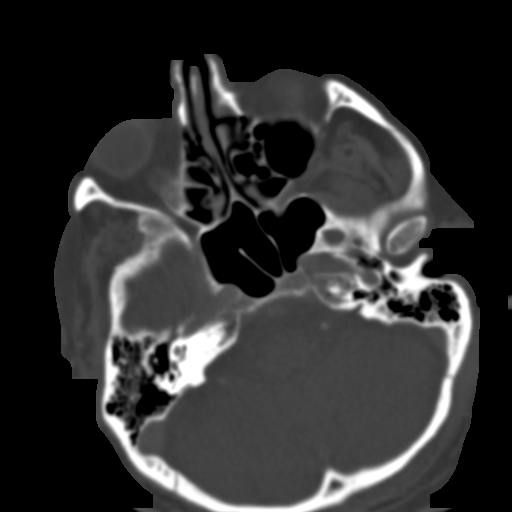
[im 68/74  bone]
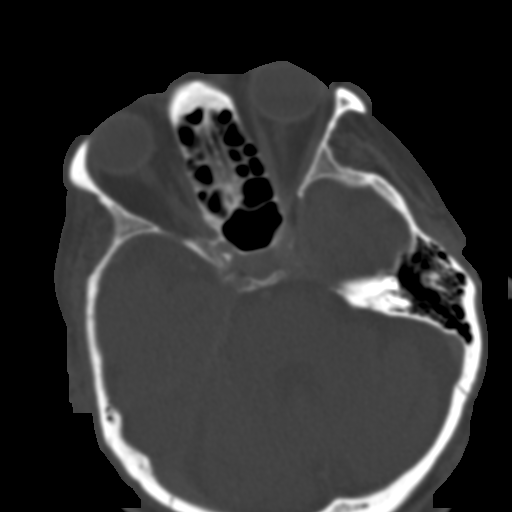

[Series 604: <mpr thick range(2)> · coronal · 0.35mm/px · 3 of 96 slices shown]
[im 32/96  bone]
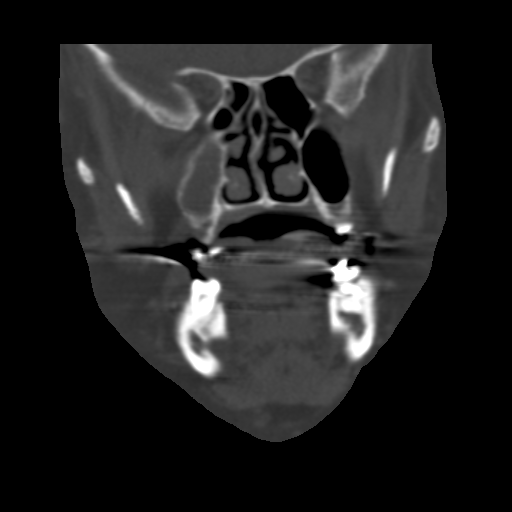
[im 43/96  bone]
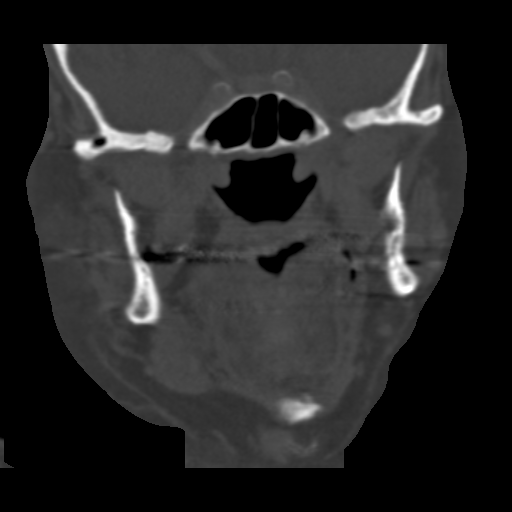
[im 53/96  bone]
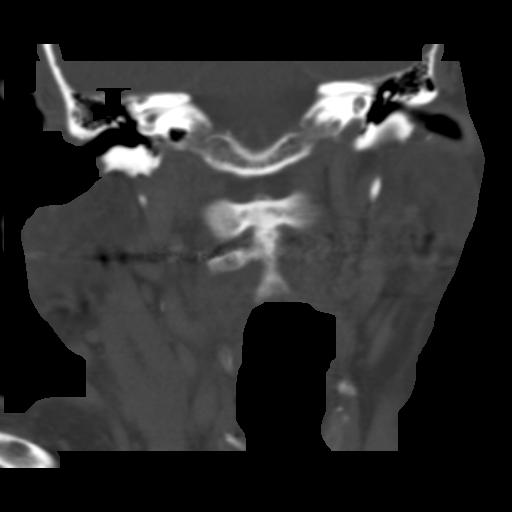

[Series 605: <mpr thick range(3)> · sagittal · 0.35mm/px · 3 of 91 slices shown]
[im 31/91  bone]
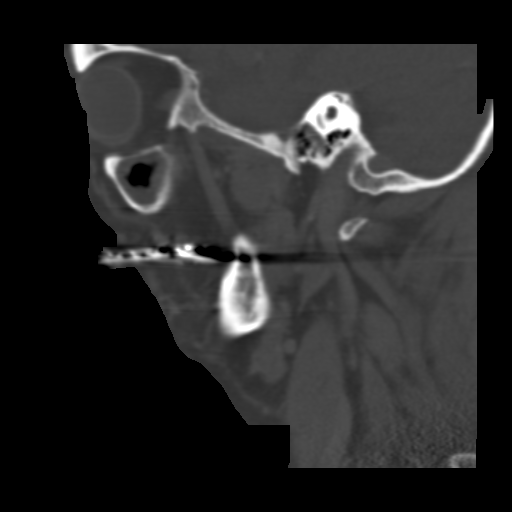
[im 46/91  bone]
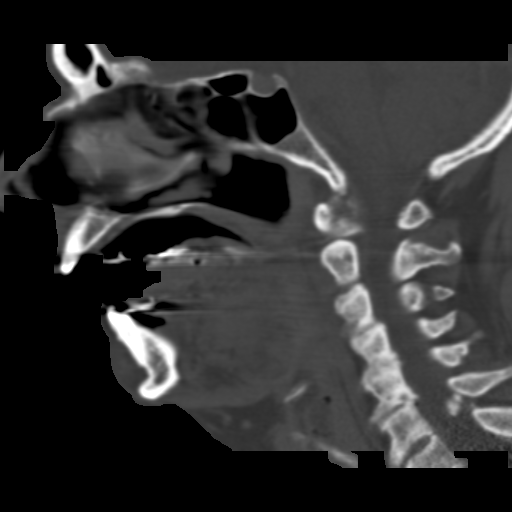
[im 61/91  bone]
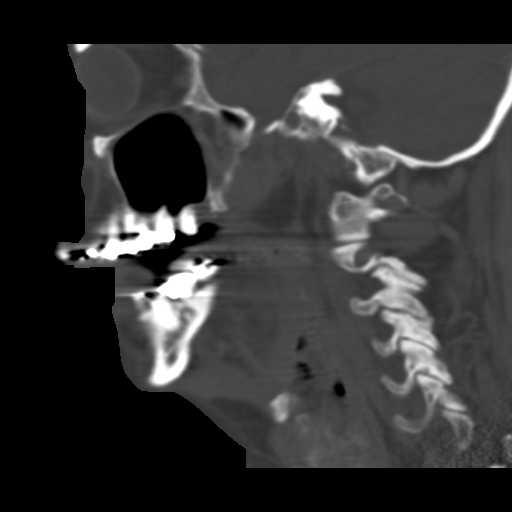

[16 of 47 positions shown; findings below may reference images not displayed]

FINDINGS: There is moderate asymmetric mucosal thickening involving the right
maxillary sinus. Periapical abscess is identified involving the
right upper second molar. There is erosion of the abscess through
the floor of the right maxillary sinus, image 27 of series 602. The
visualized intracranial contents are unremarkable. No drainable soft
tissue abscess identified. The orbits appear normal.
IMPRESSION: 1. Right upper periapical abscess with associated right maxillary
sinus inflammation.
